# Patient Record
Sex: Male | Born: 1975 | Race: White | Hispanic: No | Marital: Married | Smoking: Current every day smoker
Health system: Southern US, Community
[De-identification: ages and names within clinical notes are randomized; demographics above are authoritative.]

## PROBLEM LIST (undated history)

## (undated) DIAGNOSIS — K5792 Diverticulitis of intestine, part unspecified, without perforation or abscess without bleeding: Secondary | ICD-10-CM

## (undated) DIAGNOSIS — N189 Chronic kidney disease, unspecified: Secondary | ICD-10-CM

## (undated) HISTORY — PX: NO PAST SURGERIES: SHX2092

---

## 2002-11-19 ENCOUNTER — Encounter: Payer: Self-pay | Admitting: Emergency Medicine

## 2002-11-19 ENCOUNTER — Emergency Department (HOSPITAL_COMMUNITY): Admission: EM | Admit: 2002-11-19 | Discharge: 2002-11-19 | Payer: Self-pay | Admitting: Emergency Medicine

## 2005-10-14 ENCOUNTER — Emergency Department: Payer: Self-pay | Admitting: Emergency Medicine

## 2009-03-09 ENCOUNTER — Emergency Department: Payer: Self-pay | Admitting: Emergency Medicine

## 2013-05-29 DIAGNOSIS — K5792 Diverticulitis of intestine, part unspecified, without perforation or abscess without bleeding: Secondary | ICD-10-CM

## 2013-05-29 HISTORY — DX: Diverticulitis of intestine, part unspecified, without perforation or abscess without bleeding: K57.92

## 2013-06-03 ENCOUNTER — Encounter (HOSPITAL_COMMUNITY): Payer: Self-pay | Admitting: Emergency Medicine

## 2013-06-03 ENCOUNTER — Emergency Department (HOSPITAL_COMMUNITY): Payer: Self-pay

## 2013-06-03 ENCOUNTER — Inpatient Hospital Stay (HOSPITAL_COMMUNITY)
Admission: EM | Admit: 2013-06-03 | Discharge: 2013-06-07 | DRG: 392 | Disposition: A | Discharge status: Home / Self Care | Payer: MEDICAID | Attending: Internal Medicine | Admitting: Internal Medicine

## 2013-06-03 DIAGNOSIS — R110 Nausea: Secondary | ICD-10-CM

## 2013-06-03 DIAGNOSIS — R112 Nausea with vomiting, unspecified: Secondary | ICD-10-CM | POA: Diagnosis present

## 2013-06-03 DIAGNOSIS — D72829 Elevated white blood cell count, unspecified: Secondary | ICD-10-CM

## 2013-06-03 DIAGNOSIS — R109 Unspecified abdominal pain: Secondary | ICD-10-CM

## 2013-06-03 DIAGNOSIS — K5792 Diverticulitis of intestine, part unspecified, without perforation or abscess without bleeding: Secondary | ICD-10-CM

## 2013-06-03 DIAGNOSIS — K5289 Other specified noninfective gastroenteritis and colitis: Secondary | ICD-10-CM | POA: Diagnosis present

## 2013-06-03 DIAGNOSIS — F17200 Nicotine dependence, unspecified, uncomplicated: Secondary | ICD-10-CM

## 2013-06-03 DIAGNOSIS — K5732 Diverticulitis of large intestine without perforation or abscess without bleeding: Principal | ICD-10-CM | POA: Diagnosis present

## 2013-06-03 DIAGNOSIS — R197 Diarrhea, unspecified: Secondary | ICD-10-CM

## 2013-06-03 DIAGNOSIS — E871 Hypo-osmolality and hyponatremia: Secondary | ICD-10-CM

## 2013-06-03 LAB — COMPREHENSIVE METABOLIC PANEL
ALT: 24 U/L (ref 0–53)
AST: 17 U/L (ref 0–37)
Albumin: 3.9 g/dL (ref 3.5–5.2)
Alkaline Phosphatase: 77 U/L (ref 39–117)
BUN: 16 mg/dL (ref 6–23)
CO2: 30 mEq/L (ref 19–32)
Calcium: 9.6 mg/dL (ref 8.4–10.5)
Chloride: 98 mEq/L (ref 96–112)
Creatinine, Ser: 1.27 mg/dL (ref 0.50–1.35)
GFR calc Af Amer: 83 mL/min — ABNORMAL LOW (ref >90)
GFR calc non Af Amer: 72 mL/min — ABNORMAL LOW (ref >90)
Glucose, Bld: 140 mg/dL — ABNORMAL HIGH (ref 70–99)
Potassium: 3.8 mEq/L (ref 3.5–5.1)
Sodium: 135 mEq/L (ref 135–145)
Total Bilirubin: 1 mg/dL (ref 0.3–1.2)
Total Protein: 7.5 g/dL (ref 6.0–8.3)

## 2013-06-03 LAB — CBC WITH DIFFERENTIAL/PLATELET
Basophils Absolute: 0 10*3/uL (ref 0.0–0.1)
Basophils Relative: 0 % (ref 0–1)
Eosinophils Absolute: 0.1 10*3/uL (ref 0.0–0.7)
Eosinophils Relative: 0 % (ref 0–5)
HCT: 49.6 % (ref 39.0–52.0)
Hemoglobin: 17.4 g/dL — ABNORMAL HIGH (ref 13.0–17.0)
Lymphocytes Relative: 15 % (ref 12–46)
Lymphs Abs: 2.5 10*3/uL (ref 0.7–4.0)
MCH: 33.9 pg (ref 26.0–34.0)
MCHC: 35.1 g/dL (ref 30.0–36.0)
MCV: 96.5 fL (ref 78.0–100.0)
Monocytes Absolute: 1.3 10*3/uL — ABNORMAL HIGH (ref 0.1–1.0)
Monocytes Relative: 8 % (ref 3–12)
Neutro Abs: 12.9 10*3/uL — ABNORMAL HIGH (ref 1.7–7.7)
Neutrophils Relative %: 77 % (ref 43–77)
Platelets: 272 10*3/uL (ref 150–400)
RBC: 5.14 MIL/uL (ref 4.22–5.81)
RDW: 12.7 % (ref 11.5–15.5)
WBC: 16.8 10*3/uL — ABNORMAL HIGH (ref 4.0–10.5)

## 2013-06-03 LAB — LIPASE, BLOOD: Lipase: 21 U/L (ref 11–59)

## 2013-06-03 MED ORDER — HYDROMORPHONE HCL PF 1 MG/ML IJ SOLN
1.0000 mg | INTRAMUSCULAR | Status: DC | PRN
  Administered 2013-06-03: 1 mg via INTRAVENOUS
  Filled 2013-06-03: qty 1

## 2013-06-03 MED ORDER — PROMETHAZINE HCL 25 MG/ML IJ SOLN
12.5000 mg | Freq: Four times a day (QID) | INTRAMUSCULAR | Status: DC | PRN
Start: 2013-06-03 — End: 2013-06-07
  Administered 2013-06-04: 12.5 mg via INTRAVENOUS
  Filled 2013-06-03: qty 1

## 2013-06-03 MED ORDER — ONDANSETRON HCL 4 MG/2ML IJ SOLN: 4.0000 mg | Freq: Three times a day (TID) | INTRAMUSCULAR | Status: AC | PRN

## 2013-06-03 MED ORDER — SODIUM CHLORIDE 0.9 % IV SOLN: INTRAVENOUS | Status: AC

## 2013-06-03 MED ORDER — IOHEXOL 300 MG/ML  SOLN
100.0000 mL | Freq: Once | INTRAMUSCULAR | Status: AC | PRN
  Administered 2013-06-03: 100 mL via INTRAVENOUS

## 2013-06-03 MED ORDER — CIPROFLOXACIN IN D5W 400 MG/200ML IV SOLN
INTRAVENOUS | Status: AC
  Filled 2013-06-03: qty 200

## 2013-06-03 MED ORDER — CIPROFLOXACIN IN D5W 400 MG/200ML IV SOLN
400.0000 mg | Freq: Two times a day (BID) | INTRAVENOUS | Status: DC
  Administered 2013-06-04 – 2013-06-07 (×7): 400 mg via INTRAVENOUS
  Filled 2013-06-03 (×7): qty 200

## 2013-06-03 MED ORDER — IOHEXOL 300 MG/ML  SOLN
50.0000 mL | Freq: Once | INTRAMUSCULAR | Status: AC | PRN
  Administered 2013-06-03: 50 mL via ORAL

## 2013-06-03 MED ORDER — ONDANSETRON HCL 4 MG PO TABS: 4.0000 mg | ORAL_TABLET | Freq: Four times a day (QID) | ORAL | Status: DC | PRN

## 2013-06-03 MED ORDER — PANTOPRAZOLE SODIUM 40 MG IV SOLR
40.0000 mg | Freq: Once | INTRAVENOUS | Status: AC
  Administered 2013-06-03: 40 mg via INTRAVENOUS
  Filled 2013-06-03: qty 40

## 2013-06-03 MED ORDER — SODIUM CHLORIDE 0.9 % IV SOLN
INTRAVENOUS | Status: DC
  Administered 2013-06-04 – 2013-06-07 (×4): via INTRAVENOUS

## 2013-06-03 MED ORDER — ACETAMINOPHEN 325 MG PO TABS: 650.0000 mg | ORAL_TABLET | Freq: Four times a day (QID) | ORAL | Status: DC | PRN

## 2013-06-03 MED ORDER — NICOTINE 21 MG/24HR TD PT24
21.0000 mg | MEDICATED_PATCH | Freq: Once | TRANSDERMAL | Status: AC
  Administered 2013-06-03 – 2013-06-04 (×2): 21 mg via TRANSDERMAL
  Filled 2013-06-03 (×2): qty 1

## 2013-06-03 MED ORDER — METRONIDAZOLE IN NACL 5-0.79 MG/ML-% IV SOLN
500.0000 mg | Freq: Once | INTRAVENOUS | Status: DC
  Filled 2013-06-03: qty 100

## 2013-06-03 MED ORDER — ONDANSETRON HCL 4 MG/2ML IJ SOLN
4.0000 mg | Freq: Once | INTRAMUSCULAR | Status: AC
  Administered 2013-06-03: 4 mg via INTRAVENOUS
  Filled 2013-06-03: qty 2

## 2013-06-03 MED ORDER — METRONIDAZOLE IN NACL 5-0.79 MG/ML-% IV SOLN
500.0000 mg | Freq: Three times a day (TID) | INTRAVENOUS | Status: DC
  Administered 2013-06-03 – 2013-06-07 (×11): 500 mg via INTRAVENOUS
  Filled 2013-06-03 (×12): qty 100

## 2013-06-03 MED ORDER — ACETAMINOPHEN 650 MG RE SUPP: 650.0000 mg | Freq: Four times a day (QID) | RECTAL | Status: DC | PRN

## 2013-06-03 MED ORDER — METRONIDAZOLE IN NACL 5-0.79 MG/ML-% IV SOLN
INTRAVENOUS | Status: AC
  Filled 2013-06-03: qty 100

## 2013-06-03 MED ORDER — HYDROMORPHONE HCL PF 1 MG/ML IJ SOLN
1.0000 mg | Freq: Once | INTRAMUSCULAR | Status: AC
  Administered 2013-06-03: 1 mg via INTRAVENOUS
  Filled 2013-06-03: qty 1

## 2013-06-03 MED ORDER — MORPHINE SULFATE 2 MG/ML IJ SOLN
2.0000 mg | INTRAMUSCULAR | Status: DC | PRN
  Administered 2013-06-04 – 2013-06-05 (×8): 2 mg via INTRAVENOUS
  Filled 2013-06-03 (×8): qty 1

## 2013-06-03 MED ORDER — ENOXAPARIN SODIUM 40 MG/0.4ML ~~LOC~~ SOLN
40.0000 mg | SUBCUTANEOUS | Status: DC
Start: 2013-06-03 — End: 2013-06-07
  Administered 2013-06-03 – 2013-06-06 (×4): 40 mg via SUBCUTANEOUS
  Filled 2013-06-03 (×3): qty 0.4

## 2013-06-03 MED ORDER — ONDANSETRON HCL 4 MG/2ML IJ SOLN
4.0000 mg | Freq: Four times a day (QID) | INTRAMUSCULAR | Status: DC | PRN
  Administered 2013-06-03 – 2013-06-06 (×3): 4 mg via INTRAVENOUS
  Filled 2013-06-03: qty 2

## 2013-06-03 MED ORDER — CIPROFLOXACIN IN D5W 400 MG/200ML IV SOLN
400.0000 mg | Freq: Once | INTRAVENOUS | Status: AC
  Administered 2013-06-03: 400 mg via INTRAVENOUS
  Filled 2013-06-03: qty 200

## 2013-06-03 NOTE — ED Provider Notes (Signed)
History  This chart was scribed for Benny Lennert, MD by Bennett Scrape, ED Scribe. This patient was seen in room APA04/APA04 and the patient's care was started at 3:32 PM.  CSN: [id number]  Arrival date & time 06/03/13  1515   First MD Initiated Contact with Patient 06/03/13 1532     Chief Complaint  Patient presents with  . Abdominal Pain    Patient is a 37 y.o. male presenting with abdominal pain. The history is provided by the patient. No language interpreter was used.  Abdominal Pain This is a new problem. The current episode started 2 days ago. The problem occurs constantly. The problem has been gradually worsening. Associated symptoms include abdominal pain. Pertinent negatives include no chest pain and no headaches. The symptoms are aggravated by walking. Nothing relieves the symptoms. Treatments tried: OTC meds. The treatment provided mild relief.    HPI Comments: Caleb Clements is a 37 y.o. male who presents to the Emergency Department complaining of constant diffuse abdominal pain described as a cramping and shooting pain with associated nausea and one episode of hematemesis described as coffee grounds that has been gradually worsening over the past 2 days. Pt reports that the pain started gradually after eating late night left-over hot dogs and lists sweats and chills with the original onset which have since resolved. He reports that the pain is aggravated by movement and admits that he has tried Pepto bismol and ex-lax with mild improvement. He reports that the pain was similar to constipation originally, but he denies having any prior episodes since the pain has worsened. He denies any prior abdominal surgeries. He denies fevers, CP and SOB as associated symptoms. Pt does not have a h/o chronic medical conditions and is a current everyday smoker but denies alcohol use.   History reviewed. No pertinent past medical history.  History reviewed. No pertinent past surgical  history.  History reviewed. No pertinent family history.  History  Substance Use Topics  . Smoking status: Current Every Day Smoker  . Smokeless tobacco: Not on file  . Alcohol Use: No    Review of Systems  Constitutional: Negative for appetite change and fatigue.  HENT: Negative for congestion, sinus pressure and ear discharge.   Eyes: Negative for discharge.  Respiratory: Negative for cough.   Cardiovascular: Negative for chest pain.  Gastrointestinal: Positive for vomiting and abdominal pain. Negative for diarrhea.  Genitourinary: Negative for frequency and hematuria.  Musculoskeletal: Negative for back pain.  Skin: Negative for rash.  Neurological: Negative for seizures and headaches.  Psychiatric/Behavioral: Negative for hallucinations.    Allergies  Review of patient's allergies indicates no known allergies.  Home Medications  No current outpatient prescriptions on file.  Triage Vitals: BP 137/81  Pulse 95  Temp(Src) 98.5 F (36.9 C) (Oral)  Resp 20  Ht 5\' 8"  (1.727 m)  Wt 180 lb (81.647 kg)  BMI 27.38 kg/m2  SpO2 98%  Physical Exam  Nursing note and vitals reviewed. Constitutional: He is oriented to person, place, and time. He appears well-developed and well-nourished.  HENT:  Head: Normocephalic and atraumatic.  Eyes: Conjunctivae and EOM are normal. No scleral icterus.  Neck: Neck supple. No thyromegaly present.  Cardiovascular: Normal rate and regular rhythm.  Exam reveals no gallop and no friction rub.   No murmur heard. Pulmonary/Chest: Effort normal and breath sounds normal. No stridor. He has no wheezes. He has no rales. He exhibits no tenderness.  Abdominal: Soft. He exhibits distension (mild). There is  tenderness. There is no rebound.  Moderate tenderness throughout the abdomen  Musculoskeletal: Normal range of motion. He exhibits no edema.  Lymphadenopathy:    He has no cervical adenopathy.  Neurological: He is alert and oriented to person,  place, and time. Coordination normal.  Skin: Skin is warm and dry. No rash noted. No erythema.  Psychiatric: He has a normal mood and affect. His behavior is normal.    ED Course  Procedures (including critical care time)  Medications  HYDROmorphone (DILAUDID) injection 1 mg (not administered)  ondansetron (ZOFRAN) injection 4 mg (not administered)  pantoprazole (PROTONIX) injection 40 mg (not administered)    DIAGNOSTIC STUDIES: Oxygen Saturation is 98% on room air, normal by my interpretation.    COORDINATION OF CARE: 3:38 PM-Discussed treatment plan which includes medications, CT of abdomen, CBC panel and CMP with pt at bedside and pt agreed to plan.   Labs Reviewed  CBC WITH DIFFERENTIAL  COMPREHENSIVE METABOLIC PANEL   No results found. No diagnosis found.  MDM  diverticulitis The chart was scribed for me under my direct supervision.  I personally performed the history, physical, and medical decision making and all procedures in the evaluation of this patient.Benny Lennert, MD 06/03/13 [phone]

## 2013-06-03 NOTE — ED Notes (Signed)
Mid abd pain x 2 days. N/v once last night, none since and states emesis was coffee ground black. deneis diarrhea. Pain worse with movement. lnbm this am and was black. Mm dry. Aching pain.

## 2013-06-03 NOTE — H&P (Signed)
PCP:   No primary provider on file.   Chief Complaint:  Abdominal pain  HPI: -year-old male with no significant past medical history came to the ED after patient started abdominal pain for 2 days. Patient says that on July 4 toes of [employer] out and he ate sausage, and after that patient started having abdominal pain which was in the whole abdomen, he also admits to having some nausea but no vomiting. This morning patient started shivering and had worsening of the pain. Yesterday patient took medications for diarrhea over-the-counter along with Pepto-Bismol. This morning patient had black colored runny diarrhea. He denies chest pain or shortness of breath. No dysuria urgency frequency of urination.  Allergies:  No Known Allergies   History reviewed. No pertinent past medical history.  History reviewed. No pertinent past surgical history.  Prior to Admission medications   Not on File    Social History:  reports that he has been smoking.  He does not have any smokeless tobacco history on file. He reports that he does not drink alcohol or use illicit drugs.  History reviewed. No pertinent family history.   All the positives are listed in BOLD  Review of Systems:  HEENT: Denies headache, blurred vision, runny nose, sore throat,  Neck: Denies thyroid problems,lymphadenopathy Chest : Denies shortness of breath, no history of COPD Heart : Denies Chest pain,  coronary arterey disease GI: Denies  nausea, vomiting, diarrhea, constipation GU: Denies dysuria, urgency, frequency of urination, hematuria Neuro: Denies stroke, seizures, syncope Psych: Denies depression, anxiety, hallucinations   Physical Exam: Blood pressure 113/68, pulse 69, temperature 98.5 F (36.9 C), temperature source Oral, resp. rate 16, height 5\' 8"  (1.727 m), weight 81.647 kg (180 lb), SpO2 97.00%. Constitutional:   Patient is a well-developed and well-nourished male* in no acute distress and cooperative with  exam. Head: Normocephalic and atraumatic Mouth: Mucus membranes moist Eyes: PERRL, EOMI, conjunctivae normal Neck: Supple, No Thyromegaly Cardiovascular: RRR, S1 normal, S2 normal Pulmonary/Chest: CTAB, no wheezes, rales, or rhonchi Abdominal: Soft. Positive tenderness in the right and left lower quadrants and right upper quadrant, no guarding no rigidity positive rebound tenderness   Neurological: A&O x3, Strenght is normal and symmetric bilaterally, cranial nerve II-XII are grossly intact, no focal motor deficit, sensory intact to light touch bilaterally.  Extremities : No Cyanosis, Clubbing or Edema   Labs on Admission:  Results for orders placed during the hospital encounter of 06/03/13 (from the past 48 hour(s))  CBC WITH DIFFERENTIAL     Status: Abnormal   Collection Time    06/03/13  3:45 PM      Result Value Range   WBC 16.8 (*) 4.0 - 10.5 K/uL   RBC 5.14  4.22 - 5.81 MIL/uL   Hemoglobin 17.4 (*) 13.0 - 17.0 g/dL   HCT 16.1  09.6 - 04.5 %   MCV 96.5  78.0 - 100.0 fL   MCH 33.9  26.0 - 34.0 pg   MCHC 35.1  30.0 - 36.0 g/dL   RDW 40.9  81.1 - 91.4 %   Platelets 272  150 - 400 K/uL   Neutrophils Relative % 77  43 - 77 %   Neutro Abs 12.9 (*) 1.7 - 7.7 K/uL   Lymphocytes Relative 15  12 - 46 %   Lymphs Abs 2.5  0.7 - 4.0 K/uL   Monocytes Relative 8  3 - 12 %   Monocytes Absolute 1.3 (*) 0.1 - 1.0 K/uL   Eosinophils Relative 0  0 - 5 %   Eosinophils Absolute 0.1  0.0 - 0.7 K/uL   Basophils Relative 0  0 - 1 %   Basophils Absolute 0.0  0.0 - 0.1 K/uL  COMPREHENSIVE METABOLIC PANEL     Status: Abnormal   Collection Time    06/03/13  3:45 PM      Result Value Range   Sodium 135  135 - 145 mEq/L   Potassium 3.8  3.5 - 5.1 mEq/L   Chloride 98  96 - 112 mEq/L   CO2 30  19 - 32 mEq/L   Glucose, Bld 140 (*) 70 - 99 mg/dL   BUN 16  6 - 23 mg/dL   Creatinine, Ser 8.29  0.50 - 1.35 mg/dL   Calcium 9.6  8.4 - 56.2 mg/dL   Total Protein 7.5  6.0 - 8.3 g/dL   Albumin 3.9  3.5 -  5.2 g/dL   AST 17  0 - 37 U/L   ALT 24  0 - 53 U/L   Alkaline Phosphatase 77  39 - 117 U/L   Total Bilirubin 1.0  0.3 - 1.2 mg/dL   GFR calc non Af Amer 72 (*) >90 mL/min   GFR calc Af Amer 83 (*) >90 mL/min   Comment:            The eGFR has been calculated     using the CKD EPI equation.     This calculation has not been     validated in all clinical     situations.     eGFR's persistently     <90 mL/min signify     possible Chronic Kidney Disease.  LIPASE, BLOOD     Status: None   Collection Time    06/03/13  3:45 PM      Result Value Range   Lipase 21  11 - 59 U/L    Radiological Exams on Admission: Ct Abdomen Pelvis W Contrast  06/03/2013   *RADIOLOGY REPORT*  Clinical Data: Abdominal pain, nausea, hematemesis  CT ABDOMEN AND PELVIS WITH CONTRAST  Technique:  Multidetector CT imaging of the abdomen and pelvis was performed following the standard protocol during bolus administration of intravenous contrast.  Contrast: Omnipaque-300 IV  Comparison: None.  Findings: Mild dependent atelectasis lung bases.  Liver, spleen, pancreas, and adrenal glands are within normal limits.  Gallbladder is unremarkable.  No intrahepatic or extrahepatic ductal dilatation.  9 mm right upper pole renal cyst (series 2/image 3).  Left kidney is within normal limits.  No hydronephrosis.  No evidence of bowel obstruction.  Normal appendix.  Wall thickening/inflammatory changes along the proximal sigmoid colon (series 2/image 77), reflecting sigmoid diverticulitis.  A few scattered tiny foci of extraluminal gas anteriorly (series 2/images 76-77), compatible with microperforation. No drainable fluid collection/abscess.  No free air.  No evidence of abdominal aortic aneurysm.  No abdominopelvic ascites.  No suspicious abdominopelvic lymphadenopathy.  Prostate is unremarkable.  Bladder is within normal limits.  Visualized osseous structures are within normal limits.  IMPRESSION: Sigmoid diverticulitis.  Tiny foci of  localized extraluminal gas, suggesting microperforation.  No drainable fluid collection/abscess.  No free air.   Original Report Authenticated By: Charline Bills, M.D.    Assessment/Plan Active Problems:   Diverticulitis  Diverticulitis  Patient has sigmoid due to colitis with tiny foci of localized extraluminal gas suggesting microperforation. I called and discussed with general surgeon Dr. Lovell Sheehan who will see the patient in the morning In the meantime we'll keep him  n.p.o. IV normal saline at 125 mL per hour Him IV Cipro and Flagyl  Leukocytosis Secondary to above Follow the CBC the morning  Code status: Full code, presumed  Family discussion: Discussed with patient in detail   Time Spent on Admission: 65 min  Quron Ruddy S [facility] Pager: [phone] 06/03/2013, 8:08 PM  If 7PM-7AM, please contact night-coverage  [email or web address]  Password [email or web address]

## 2013-06-04 DIAGNOSIS — D72829 Elevated white blood cell count, unspecified: Secondary | ICD-10-CM | POA: Diagnosis present

## 2013-06-04 DIAGNOSIS — R197 Diarrhea, unspecified: Secondary | ICD-10-CM | POA: Diagnosis present

## 2013-06-04 DIAGNOSIS — R110 Nausea: Secondary | ICD-10-CM | POA: Diagnosis present

## 2013-06-04 DIAGNOSIS — R109 Unspecified abdominal pain: Secondary | ICD-10-CM | POA: Diagnosis present

## 2013-06-04 DIAGNOSIS — R112 Nausea with vomiting, unspecified: Secondary | ICD-10-CM | POA: Diagnosis present

## 2013-06-04 LAB — CBC
MCH: 33.4 pg (ref 26.0–34.0)
MCHC: 34.8 g/dL (ref 30.0–36.0)
Platelets: 289 10*3/uL (ref 150–400)
RDW: 12.7 % (ref 11.5–15.5)

## 2013-06-04 LAB — COMPREHENSIVE METABOLIC PANEL
ALT: 21 U/L (ref 0–53)
AST: 14 U/L (ref 0–37)
Calcium: 8.8 mg/dL (ref 8.4–10.5)
GFR calc Af Amer: >90 mL/min (ref >90)
Glucose, Bld: 96 mg/dL (ref 70–99)
Sodium: 137 mEq/L (ref 135–145)
Total Protein: 6.6 g/dL (ref 6.0–8.3)

## 2013-06-04 NOTE — Consult Note (Signed)
Reason for Consult: Sigmoid diverticulitis Referring Physician: [facility]  Jerrian Mells is an 37 y.o. male.  HPI: Patient is a 37 year old white male who was admitted by the hospitalist for treatment of sigmoid diverticulitis. This is his first episode. CT scan the abdomen reveals sigmoid diverticulitis with microperforation but no extensive pneumoperitoneum or abscess formation.  History reviewed. No pertinent past medical history.  History reviewed. No pertinent past surgical history.  History reviewed. No pertinent family history.  Social History:  reports that he has been smoking.  He uses smokeless tobacco. He reports that he does not drink alcohol or use illicit drugs.  Allergies: No Known Allergies  Medications: I have reviewed the patient's current medications.  Results for orders placed during the hospital encounter of 06/03/13 (from the past 48 hour(s))  CBC WITH DIFFERENTIAL     Status: Abnormal   Collection Time    06/03/13  3:45 PM      Result Value Range   WBC 16.8 (*) 4.0 - 10.5 K/uL   RBC 5.14  4.22 - 5.81 MIL/uL   Hemoglobin 17.4 (*) 13.0 - 17.0 g/dL   HCT 54.0  98.1 - 19.1 %   MCV 96.5  78.0 - 100.0 fL   MCH 33.9  26.0 - 34.0 pg   MCHC 35.1  30.0 - 36.0 g/dL   RDW 47.8  29.5 - 62.1 %   Platelets 272  150 - 400 K/uL   Neutrophils Relative % 77  43 - 77 %   Neutro Abs 12.9 (*) 1.7 - 7.7 K/uL   Lymphocytes Relative 15  12 - 46 %   Lymphs Abs 2.5  0.7 - 4.0 K/uL   Monocytes Relative 8  3 - 12 %   Monocytes Absolute 1.3 (*) 0.1 - 1.0 K/uL   Eosinophils Relative 0  0 - 5 %   Eosinophils Absolute 0.1  0.0 - 0.7 K/uL   Basophils Relative 0  0 - 1 %   Basophils Absolute 0.0  0.0 - 0.1 K/uL  COMPREHENSIVE METABOLIC PANEL     Status: Abnormal   Collection Time    06/03/13  3:45 PM      Result Value Range   Sodium 135  135 - 145 mEq/L   Potassium 3.8  3.5 - 5.1 mEq/L   Chloride 98  96 - 112 mEq/L   CO2 30  19 - 32 mEq/L   Glucose, Bld 140 (*) 70 - 99  mg/dL   BUN 16  6 - 23 mg/dL   Creatinine, Ser 3.08  0.50 - 1.35 mg/dL   Calcium 9.6  8.4 - 65.7 mg/dL   Total Protein 7.5  6.0 - 8.3 g/dL   Albumin 3.9  3.5 - 5.2 g/dL   AST 17  0 - 37 U/L   ALT 24  0 - 53 U/L   Alkaline Phosphatase 77  39 - 117 U/L   Total Bilirubin 1.0  0.3 - 1.2 mg/dL   GFR calc non Af Amer 72 (*) >90 mL/min   GFR calc Af Amer 83 (*) >90 mL/min   Comment:            The eGFR has been calculated     using the CKD EPI equation.     This calculation has not been     validated in all clinical     situations.     eGFR's persistently     <90 mL/min signify     possible Chronic Kidney  Disease.  LIPASE, BLOOD     Status: None   Collection Time    06/03/13  3:45 PM      Result Value Range   Lipase 21  11 - 59 U/L  CBC     Status: Abnormal   Collection Time    06/04/13  5:22 AM      Result Value Range   WBC 16.8 (*) 4.0 - 10.5 K/uL   RBC 4.67  4.22 - 5.81 MIL/uL   Hemoglobin 15.6  13.0 - 17.0 g/dL   HCT 40.9  81.1 - 91.4 %   MCV 95.9  78.0 - 100.0 fL   MCH 33.4  26.0 - 34.0 pg   MCHC 34.8  30.0 - 36.0 g/dL   RDW 78.2  95.6 - 21.3 %   Platelets 289  150 - 400 K/uL  COMPREHENSIVE METABOLIC PANEL     Status: Abnormal   Collection Time    06/04/13  5:22 AM      Result Value Range   Sodium 137  135 - 145 mEq/L   Potassium 3.6  3.5 - 5.1 mEq/L   Chloride 101  96 - 112 mEq/L   CO2 26  19 - 32 mEq/L   Glucose, Bld 96  70 - 99 mg/dL   BUN 15  6 - 23 mg/dL   Creatinine, Ser 0.86  0.50 - 1.35 mg/dL   Calcium 8.8  8.4 - 57.8 mg/dL   Total Protein 6.6  6.0 - 8.3 g/dL   Albumin 3.4 (*) 3.5 - 5.2 g/dL   AST 14  0 - 37 U/L   ALT 21  0 - 53 U/L   Alkaline Phosphatase 65  39 - 117 U/L   Total Bilirubin 0.9  0.3 - 1.2 mg/dL   GFR calc non Af Amer 86 (*) >90 mL/min   GFR calc Af Amer >90  >90 mL/min   Comment:            The eGFR has been calculated     using the CKD EPI equation.     This calculation has not been     validated in all clinical     situations.      eGFR's persistently     <90 mL/min signify     possible Chronic Kidney Disease.    Ct Abdomen Pelvis W Contrast  06/03/2013   *RADIOLOGY REPORT*  Clinical Data: Abdominal pain, nausea, hematemesis  CT ABDOMEN AND PELVIS WITH CONTRAST  Technique:  Multidetector CT imaging of the abdomen and pelvis was performed following the standard protocol during bolus administration of intravenous contrast.  Contrast: Omnipaque-300 IV  Comparison: None.  Findings: Mild dependent atelectasis lung bases.  Liver, spleen, pancreas, and adrenal glands are within normal limits.  Gallbladder is unremarkable.  No intrahepatic or extrahepatic ductal dilatation.  9 mm right upper pole renal cyst (series 2/image 3).  Left kidney is within normal limits.  No hydronephrosis.  No evidence of bowel obstruction.  Normal appendix.  Wall thickening/inflammatory changes along the proximal sigmoid colon (series 2/image 77), reflecting sigmoid diverticulitis.  A few scattered tiny foci of extraluminal gas anteriorly (series 2/images 76-77), compatible with microperforation. No drainable fluid collection/abscess.  No free air.  No evidence of abdominal aortic aneurysm.  No abdominopelvic ascites.  No suspicious abdominopelvic lymphadenopathy.  Prostate is unremarkable.  Bladder is within normal limits.  Visualized osseous structures are within normal limits.  IMPRESSION: Sigmoid diverticulitis.  Tiny foci of localized extraluminal gas,  suggesting microperforation.  No drainable fluid collection/abscess.  No free air.   Original Report Authenticated By: Charline Bills, M.D.    ROS: See chart Blood pressure 122/68, pulse 52, temperature 98.2 F (36.8 C), temperature source Oral, resp. rate 20, height 5\' 8"  (1.727 m), weight 89.268 kg (196 lb 12.8 oz), SpO2 99.00%. Physical Exam: Pleasant white male no acute distress. Abdomen is soft but diffusely tender. No rigidity is noted. No pedal splenomegaly or masses are  noted.  Assessment/Plan: Impression: Sigmoid diverticulitis with microperforation. No significant peritonitis at this time. No need for acute surgical intervention. Plan: Continue current treatment plan. We'll follow with you.  Hayato Guaman A 06/04/2013, 8:54 AM

## 2013-06-04 NOTE — Progress Notes (Signed)
Utilization Review Complete  

## 2013-06-04 NOTE — Progress Notes (Signed)
[facility] PROGRESS NOTE  Caleb Clements TFT:[id number] DOB: 03-Nov-1976 DOA: 06/03/2013 PCP: No primary provider on file.  Assessment/Plan:  Diverticulitis: CT abdomen notes sigmoid diverticulitis with concerns for microperforation. Continues with diffuse abdominal pain. Await surgical consult. Continue cipro and flagy day #2. Provide pain medication. Keep NPO for now. Monitor.  Active Problems:  Leukocytosis, unspecified: likely related to above. Pt is afebrile and non-toxic appearing. Continue cipro/flagyl day #2. Monitor.   Abdominal pain, unspecified site: diffuse with mild distention. Continue with pain medication as getting adequate relief.   Nausea: improved. Continue anti-emetic  Diarrhea: one episode since admission.   Code Status: full Family Communication: wife at bedside Disposition Plan: home when ready.  Consultants:  Dr. Lovell Sheehan general surgery  Procedures:  none  Antibiotics:  cipro 06/03/13>>  Flagyl 06/03/13>>  HPI/Subjective: Awake, reports feeling "slightly better i reckon" reports continued abdominal pain at rest worse with movement.   Objective: Filed Vitals:   06/03/13 1859 06/03/13 1943 06/03/13 2342 06/04/13 0455  BP: 113/68 126/78 110/70 122/68  Pulse: 69 66 62 52  Temp:  98.5 F (36.9 C) 98.3 F (36.8 C) 98.2 F (36.8 C)  TempSrc:  Oral Oral Oral  Resp:  22 20 20   Height:  5\' 8"  (1.727 m)    Weight:  89.268 kg (196 lb 12.8 oz)    SpO2: 97% 98% 96% 99%    Intake/Output Summary (Last 24 hours) at 06/04/13 0843 Last data filed at 06/04/13 0700  Gross per 24 hour  Intake 1756.25 ml  Output    400 ml  Net 1356.25 ml   Filed Weights   06/03/13 1528 06/03/13 1943  Weight: 81.647 kg (180 lb) 89.268 kg (196 lb 12.8 oz)    Exam:   General:  Well nourished NAD  Cardiovascular: RRR No MGR No LE edema  Respiratory: normal effort BS clear bilaterally no wheeze no rhonchi  Abdomen: mildly distended. Soft +BS diffuse tenderness  to very soft palpation. No guarding no rigidity  Musculoskeletal: no clubbing no cyanosis   Data Reviewed: Basic Metabolic Panel:  Recent Labs Lab 06/03/13 1545 06/04/13 0522  NA 135 137  K 3.8 3.6  CL 98 101  CO2 30 26  GLUCOSE 140* 96  BUN 16 15  CREATININE 1.27 1.10  CALCIUM 9.6 8.8   Liver Function Tests:  Recent Labs Lab 06/03/13 1545 06/04/13 0522  AST 17 14  ALT 24 21  ALKPHOS 77 65  BILITOT 1.0 0.9  PROT 7.5 6.6  ALBUMIN 3.9 3.4*    Recent Labs Lab 06/03/13 1545  LIPASE 21   CBC:  Recent Labs Lab 06/03/13 1545 06/04/13 0522  WBC 16.8* 16.8*  NEUTROABS 12.9*  --   HGB 17.4* 15.6  HCT 49.6 44.8  MCV 96.5 95.9  PLT 272 289   Studies: Ct Abdomen Pelvis W Contrast  06/03/2013   *RADIOLOGY REPORT*  Clinical Data: Abdominal pain, nausea, hematemesis  CT ABDOMEN AND PELVIS WITH CONTRAST  Technique:  Multidetector CT imaging of the abdomen and pelvis was performed following the standard protocol during bolus administration of intravenous contrast.  Contrast: Omnipaque-300 IV  Comparison: None.  Findings: Mild dependent atelectasis lung bases.  Liver, spleen, pancreas, and adrenal glands are within normal limits.  Gallbladder is unremarkable.  No intrahepatic or extrahepatic ductal dilatation.  9 mm right upper pole renal cyst (series 2/image 3).  Left kidney is within normal limits.  No hydronephrosis.  No evidence of bowel obstruction.  Normal appendix.  Wall thickening/inflammatory  changes along the proximal sigmoid colon (series 2/image 77), reflecting sigmoid diverticulitis.  A few scattered tiny foci of extraluminal gas anteriorly (series 2/images 76-77), compatible with microperforation. No drainable fluid collection/abscess.  No free air.  No evidence of abdominal aortic aneurysm.  No abdominopelvic ascites.  No suspicious abdominopelvic lymphadenopathy.  Prostate is unremarkable.  Bladder is within normal limits.  Visualized osseous structures are within  normal limits.  IMPRESSION: Sigmoid diverticulitis.  Tiny foci of localized extraluminal gas, suggesting microperforation.  No drainable fluid collection/abscess.  No free air.   Original Report Authenticated By: Charline Bills, M.D.    Scheduled Meds: . sodium chloride   Intravenous STAT  . ciprofloxacin  400 mg Intravenous Q12H  . enoxaparin (LOVENOX) injection  40 mg Subcutaneous Q24H  . metronidazole  500 mg Intravenous Q8H  . nicotine  21 mg Transdermal Once   Continuous Infusions: . sodium chloride 125 mL/hr at 06/04/13 0432   Time spent: 35 minutes  [facility] M  [facility] Pager [phone]. If 7PM-7AM, please contact night-coverage at [email or web address], password [facility] 06/04/2013, 8:43 AM  LOS: 1 day   I have seen and examined this patient and I agree with the above assessment and plan.  Sundeep Cary M. Elvera Lennox, MD [facility] [phone]

## 2013-06-05 ENCOUNTER — Inpatient Hospital Stay (HOSPITAL_COMMUNITY): Payer: MEDICAID

## 2013-06-05 DIAGNOSIS — R197 Diarrhea, unspecified: Secondary | ICD-10-CM

## 2013-06-05 DIAGNOSIS — E871 Hypo-osmolality and hyponatremia: Secondary | ICD-10-CM | POA: Diagnosis not present

## 2013-06-05 LAB — BASIC METABOLIC PANEL
BUN: 14 mg/dL (ref 6–23)
Chloride: 100 mEq/L (ref 96–112)
GFR calc non Af Amer: >90 mL/min (ref >90)
Glucose, Bld: 87 mg/dL (ref 70–99)
Potassium: 3.8 mEq/L (ref 3.5–5.1)

## 2013-06-05 LAB — CBC
HCT: 45.6 % (ref 39.0–52.0)
Hemoglobin: 15.8 g/dL (ref 13.0–17.0)
MCHC: 34.6 g/dL (ref 30.0–36.0)

## 2013-06-05 NOTE — Progress Notes (Signed)
[facility] PROGRESS NOTE  Arty Gradillas WUJ:[id number] DOB: [birth year] DOA: 06/03/2013 PCP: No primary provider on file.  Assessment/Plan: Diverticulitis: CT abdomen notes sigmoid diverticulitis with concerns for microperforation. Continues with diffuse abdominal pain. Slightly more distended today. Reports 2 loose stool.  Abdominal xray today with no acute abnormalities.  Appreciate surgical consult. Continue cipro and flagy day #3. Provide pain medication. Advance diet to clear liquid per surgery. Monitor.  Active Problems:   Leukocytosis, unspecified: trending upward today. See #1. Max temp 99.7.  Continue cipro/flagyl day #3. Hemodynamically stable. Continue IV fluids.  Monitor.   Abdominal pain, unspecified site: diffuse with slightly worsening distention. Continue with pain medication as getting adequate relief.   Nausea: No vomiting. Continue anti-emetic   Diarrhea: one episode since admission.   Hyponatremia: Mild. likely related to NPO status and #1. Will continue IV NS. Recheck in am  Code Status: full Family Communication: wife at bedside Disposition Plan: home when ready  Consultants:  Dr. Lovell Sheehan general surgery  Procedures:  none  Antibiotics: cipro 06/03/13>>  Flagyl 06/03/13>>   HPI/Subjective: Alert. Reports feeling about the same "no better no worse".   Objective: Filed Vitals:   06/04/13 0455 06/04/13 1330 06/04/13 2112 06/05/13 0537  BP: 122/68 122/67 109/62 114/79  Pulse: 52 82 77 75  Temp: 98.2 F (36.8 C) 98.9 F (37.2 C) 99.3 F (37.4 C) 98.8 F (37.1 C)  TempSrc: Oral  Oral Oral  Resp: 20 20 20 20   Height:      Weight:      SpO2: 99% 97% 94% 96%    Intake/Output Summary (Last 24 hours) at 06/05/13 1030 Last data filed at 06/04/13 2359  Gross per 24 hour  Intake 1931.25 ml  Output      0 ml  Net 1931.25 ml   Filed Weights   06/03/13 1528 06/03/13 1943  Weight: 81.647 kg (180 lb) 89.268 kg (196 lb 12.8 oz)     Exam:  General:  Somewhat ill appearing NAD  Cardiovascular: RRR No MGR No LE edema PPP  Respiratory: normal effort BS clear to ausculation bilaterally no wheeze no rhonchi  Abdomen: increased distention, soft +BS but very sluggish moderate diffuse tenderness to palpation particularly LLQ. No rigidity.   Musculoskeletal: no clubbing no cyanosis  Data Reviewed: Basic Metabolic Panel:  Recent Labs Lab 06/03/13 1545 06/04/13 0522 06/05/13 0603  NA 135 137 133*  K 3.8 3.6 3.8  CL 98 101 100  CO2 30 26 24   GLUCOSE 140* 96 87  BUN 16 15 14   CREATININE 1.27 1.10 1.05  CALCIUM 9.6 8.8 8.8   Liver Function Tests:  Recent Labs Lab 06/03/13 1545 06/04/13 0522  AST 17 14  ALT 24 21  ALKPHOS 77 65  BILITOT 1.0 0.9  PROT 7.5 6.6  ALBUMIN 3.9 3.4*    Recent Labs Lab 06/03/13 1545  LIPASE 21   CBC:  Recent Labs Lab 06/03/13 1545 06/04/13 0522 06/05/13 0603  WBC 16.8* 16.8* 19.3*  NEUTROABS 12.9*  --   --   HGB 17.4* 15.6 15.8  HCT 49.6 44.8 45.6  MCV 96.5 95.9 96.0  PLT 272 289 300   Studies: Ct Abdomen Pelvis W Contrast  06/03/2013   *RADIOLOGY REPORT*  Clinical Data: Abdominal pain, nausea, hematemesis  CT ABDOMEN AND PELVIS WITH CONTRAST  Technique:  Multidetector CT imaging of the abdomen and pelvis was performed following the standard protocol during bolus administration of intravenous contrast.  Contrast: Omnipaque-300 IV  Comparison: None.  Findings: Mild dependent atelectasis lung bases.  Liver, spleen, pancreas, and adrenal glands are within normal limits.  Gallbladder is unremarkable.  No intrahepatic or extrahepatic ductal dilatation.  9 mm right upper pole renal cyst (series 2/image 3).  Left kidney is within normal limits.  No hydronephrosis.  No evidence of bowel obstruction.  Normal appendix.  Wall thickening/inflammatory changes along the proximal sigmoid colon (series 2/image 77), reflecting sigmoid diverticulitis.  A few scattered tiny foci of  extraluminal gas anteriorly (series 2/images 76-77), compatible with microperforation. No drainable fluid collection/abscess.  No free air.  No evidence of abdominal aortic aneurysm.  No abdominopelvic ascites.  No suspicious abdominopelvic lymphadenopathy.  Prostate is unremarkable.  Bladder is within normal limits.  Visualized osseous structures are within normal limits.  IMPRESSION: Sigmoid diverticulitis.  Tiny foci of localized extraluminal gas, suggesting microperforation.  No drainable fluid collection/abscess.  No free air.   Original Report Authenticated By: Charline Bills, M.D.   Dg Abd 2 Views  06/05/2013   *RADIOLOGY REPORT*  Clinical Data: Pattern, abdominal pain, distention, diverticulitis with sigmoid microperforation  ABDOMEN - 2 VIEW  Comparison: CT abdomen and pelvis 06/03/2013  Findings: Small amount of retained contrast within a nondistended colon. Nonobstructive bowel gas pattern. No bowel dilatation bowel wall thickening or free intraperitoneal air. Bones unremarkable. No gross urinary tract calcification.  IMPRESSION: No acute abnormalities.   Original Report Authenticated By: Ulyses Southward, M.D.    Scheduled Meds: . ciprofloxacin  400 mg Intravenous Q12H  . enoxaparin (LOVENOX) injection  40 mg Subcutaneous Q24H  . metronidazole  500 mg Intravenous Q8H  . [COMPLETED] nicotine  21 mg Transdermal Once   Continuous Infusions: . sodium chloride 125 mL/hr at 06/05/13 0305    Principal Problem:   Diverticulitis Active Problems:   Leukocytosis, unspecified   Abdominal pain, unspecified site   Nausea alone   Diarrhea  Time spent: 30 minutes  [facility] M  [facility] Pager [phone]. If 7PM-7AM, please contact night-coverage at [email or web address], password [facility] 06/05/2013, 10:30 AM  LOS: 2 days    I have seen and examined this patient and I agree with the above assessment and plan. Landi Biscardi M. Elvera Lennox, MD [facility] [phone]

## 2013-06-05 NOTE — Progress Notes (Signed)
Subjective: States he feels about the same. He has had some watery bowel movements.  Objective: Vital signs in last 24 hours: Temp:  [98.8 F (37.1 C)-99.3 F (37.4 C)] 98.8 F (37.1 C) (07/08 0537) Pulse Rate:  [75-82] 75 (07/08 0537) Resp:  [20] 20 (07/08 0537) BP: (109-122)/(62-79) 114/79 mmHg (07/08 0537) SpO2:  [94 %-97 %] 96 % (07/08 0537) Last BM Date: 06/03/13  Intake/Output from previous day: 07/07 0701 - 07/08 0700 In: 1931.3 [I.V.:1531.3; IV Piggyback:400] Out: -  Intake/Output this shift:    General appearance: alert, cooperative and no distress GI: Slightly more distended than yesterday. He does have some bowel sounds. No rigidity is noted. He does have left-sided lower quadrant tenderness to deep palpation.  Lab Results:   Recent Labs  06/04/13 0522 06/05/13 0603  WBC 16.8* 19.3*  HGB 15.6 15.8  HCT 44.8 45.6  PLT 289 300   BMET  Recent Labs  06/04/13 0522 06/05/13 0603  NA 137 133*  K 3.6 3.8  CL 101 100  CO2 26 24  GLUCOSE 96 87  BUN 15 14  CREATININE 1.10 1.05  CALCIUM 8.8 8.8   PT/INR No results found for this basename: LABPROT, INR,  in the last 72 hours  Studies/Results: Ct Abdomen Pelvis W Contrast  06/03/2013   *RADIOLOGY REPORT*  Clinical Data: Abdominal pain, nausea, hematemesis  CT ABDOMEN AND PELVIS WITH CONTRAST  Technique:  Multidetector CT imaging of the abdomen and pelvis was performed following the standard protocol during bolus administration of intravenous contrast.  Contrast: Omnipaque-300 IV  Comparison: None.  Findings: Mild dependent atelectasis lung bases.  Liver, spleen, pancreas, and adrenal glands are within normal limits.  Gallbladder is unremarkable.  No intrahepatic or extrahepatic ductal dilatation.  9 mm right upper pole renal cyst (series 2/image 3).  Left kidney is within normal limits.  No hydronephrosis.  No evidence of bowel obstruction.  Normal appendix.  Wall thickening/inflammatory changes along the  proximal sigmoid colon (series 2/image 77), reflecting sigmoid diverticulitis.  A few scattered tiny foci of extraluminal gas anteriorly (series 2/images 76-77), compatible with microperforation. No drainable fluid collection/abscess.  No free air.  No evidence of abdominal aortic aneurysm.  No abdominopelvic ascites.  No suspicious abdominopelvic lymphadenopathy.  Prostate is unremarkable.  Bladder is within normal limits.  Visualized osseous structures are within normal limits.  IMPRESSION: Sigmoid diverticulitis.  Tiny foci of localized extraluminal gas, suggesting microperforation.  No drainable fluid collection/abscess.  No free air.   Original Report Authenticated By: Charline Bills, M.D.    Anti-infectives: Anti-infectives   Start     Dose/Rate Route Frequency Ordered Stop   06/04/13 0700  ciprofloxacin (CIPRO) IVPB 400 mg     400 mg 200 mL/hr over 60 Minutes Intravenous Every 12 hours 06/03/13 2037     06/03/13 2045  metroNIDAZOLE (FLAGYL) IVPB 500 mg     500 mg 100 mL/hr over 60 Minutes Intravenous Every 8 hours 06/03/13 2037     06/03/13 1915  ciprofloxacin (CIPRO) IVPB 400 mg     400 mg 200 mL/hr over 60 Minutes Intravenous  Once 06/03/13 1906 06/03/13 2013   06/03/13 1915  metroNIDAZOLE (FLAGYL) IVPB 500 mg  Status:  Discontinued     500 mg 100 mL/hr over 60 Minutes Intravenous  Once 06/03/13 1906 06/03/13 2037      Assessment/Plan: Sigmoid diverticulitis with microperforation. Leukocytosis still present. Agree with followup abdominal films. We'll continue to follow with you.  LOS: 2 days  Franky Macho A 06/05/2013

## 2013-06-06 DIAGNOSIS — F17200 Nicotine dependence, unspecified, uncomplicated: Secondary | ICD-10-CM | POA: Diagnosis present

## 2013-06-06 LAB — CBC
HCT: 43.8 % (ref 39.0–52.0)
Hemoglobin: 15.1 g/dL (ref 13.0–17.0)
MCH: 33.1 pg (ref 26.0–34.0)
MCV: 96.1 fL (ref 78.0–100.0)
RBC: 4.56 MIL/uL (ref 4.22–5.81)

## 2013-06-06 LAB — BASIC METABOLIC PANEL
BUN: 13 mg/dL (ref 6–23)
CO2: 24 mEq/L (ref 19–32)
Calcium: 8.8 mg/dL (ref 8.4–10.5)
Creatinine, Ser: 1.05 mg/dL (ref 0.50–1.35)
Glucose, Bld: 80 mg/dL (ref 70–99)

## 2013-06-06 MED ORDER — NICOTINE 21 MG/24HR TD PT24
21.0000 mg | MEDICATED_PATCH | Freq: Every day | TRANSDERMAL | Status: DC
  Administered 2013-06-06: 21 mg via TRANSDERMAL
  Filled 2013-06-06: qty 1

## 2013-06-06 NOTE — Progress Notes (Signed)
[facility] PROGRESS NOTE  Caleb Clements YQI:[id number] DOB: 1976/03/07 DOA: 06/03/2013 PCP: No primary provider on file.  Assessment/Plan: Diverticulitis: CT abdomen notes sigmoid diverticulitis with concerns for microperforation. Much improved today. Afebrile, WC trending downward. Slightly less distended.  Reports rormed stool.  Appreciate surgical consult. Continue cipro and flagy day #4.Advance diet per surgery. Continue pain medication.   Active Problems:   Leukocytosis, unspecified: trending down today. See #1. Afebrile.  Continue cipro/flagyl day #4. Hemodynamically stable. Continue IV fluids at lesser rate. Monitor.   Abdominal pain, unspecified site: Much improved today.  Continue with pain medication as getting adequate relief.   Nausea: much improved. No vomiting. Advancing diet Continue anti-emetic   Diarrhea: one episode since admission. Formed stool this am.   Hyponatremia: Mild. Resolved.  likely related to NPO status and #1.     Code Status: full Family Communication:  Disposition Plan: home hopefully tomorrow   Consultants:  Dr. Lovell Sheehan general surgery  Procedures:  none  Antibiotics: cipro 06/03/13>>  Flagyl 06/03/13>>   HPI/Subjective: Awake reports feeling much better. Slept well. Less pain. Ready for food  Objective: Filed Vitals:   06/05/13 0537 06/05/13 1402 06/05/13 2033 06/06/13 0438  BP: 114/79 129/76 112/71 120/71  Pulse: 75 64 66 55  Temp: 98.8 F (37.1 C) 99 F (37.2 C) 98.6 F (37 C) 98.2 F (36.8 C)  TempSrc: Oral Oral Oral Oral  Resp: 20 18 18 18   Height:      Weight:      SpO2: 96% 99% 100% 99%    Intake/Output Summary (Last 24 hours) at 06/06/13 1050 Last data filed at 06/06/13 3875  Gross per 24 hour  Intake 5290.41 ml  Output      0 ml  Net 5290.41 ml   Filed Weights   06/03/13 1528 06/03/13 1943  Weight: 81.647 kg (180 lb) 89.268 kg (196 lb 12.8 oz)    Exam:   General:  NAD  Cardiovascular: RRR No MGR No  LE edema  Respiratory: normal effort BS clear bilaterally no wheeze  Abdomen: less distention, more active BS, less tenderness to palpation. No rigidity  Musculoskeletal: no clubbing no cyanosis   Data Reviewed: Basic Metabolic Panel:  Recent Labs Lab 06/03/13 1545 06/04/13 0522 06/05/13 0603 06/06/13 0541  NA 135 137 133* 136  K 3.8 3.6 3.8 3.8  CL 98 101 100 103  CO2 30 26 24 24   GLUCOSE 140* 96 87 80  BUN 16 15 14 13   CREATININE 1.27 1.10 1.05 1.05  CALCIUM 9.6 8.8 8.8 8.8   Liver Function Tests:  Recent Labs Lab 06/03/13 1545 06/04/13 0522  AST 17 14  ALT 24 21  ALKPHOS 77 65  BILITOT 1.0 0.9  PROT 7.5 6.6  ALBUMIN 3.9 3.4*    Recent Labs Lab 06/03/13 1545  LIPASE 21   No results found for this basename: AMMONIA,  in the last 168 hours CBC:  Recent Labs Lab 06/03/13 1545 06/04/13 0522 06/05/13 0603 06/06/13 0541  WBC 16.8* 16.8* 19.3* 14.4*  NEUTROABS 12.9*  --   --   --   HGB 17.4* 15.6 15.8 15.1  HCT 49.6 44.8 45.6 43.8  MCV 96.5 95.9 96.0 96.1  PLT 272 289 300 297   Cardiac Enzymes: No results found for this basename: CKTOTAL, CKMB, CKMBINDEX, TROPONINI,  in the last 168 hours BNP (last 3 results) No results found for this basename: PROBNP,  in the last 8760 hours CBG: No results found for this basename:  GLUCAP,  in the last 168 hours  No results found for this or any previous visit (from the past 240 hour(s)).   Studies: Dg Abd 2 Views  06/05/2013   *RADIOLOGY REPORT*  Clinical Data: Pattern, abdominal pain, distention, diverticulitis with sigmoid microperforation  ABDOMEN - 2 VIEW  Comparison: CT abdomen and pelvis 06/03/2013  Findings: Small amount of retained contrast within a nondistended colon. Nonobstructive bowel gas pattern. No bowel dilatation bowel wall thickening or free intraperitoneal air. Bones unremarkable. No gross urinary tract calcification.  IMPRESSION: No acute abnormalities.   Original Report Authenticated By: Ulyses Southward, M.D.    Scheduled Meds: . ciprofloxacin  400 mg Intravenous Q12H  . enoxaparin (LOVENOX) injection  40 mg Subcutaneous Q24H  . metronidazole  500 mg Intravenous Q8H   Continuous Infusions: . sodium chloride 125 mL/hr at 06/06/13 0448    Principal Problem:   Diverticulitis Active Problems:   Leukocytosis, unspecified   Abdominal pain, unspecified site   Nausea alone   Diarrhea   Hyponatremia    Time spent: 30 minutes    [facility] M  [facility] Pager [phone]. If 7PM-7AM, please contact night-coverage at [email or web address], password [facility] 06/06/2013, 10:50 AM  LOS: 3 days   Attending: Patient seen and examined. Patient is clearly improved. Appreciate surgical input. We'll keep in the hospital today and consider discharge tomorrow depending on clinical status

## 2013-06-06 NOTE — Care Management Note (Signed)
    Page 1 of 1   06/07/2013     12:09:07 PM   CARE MANAGEMENT NOTE 06/07/2013  Patient:  Caleb Clements, Caleb Clements   Account Number:  [id number]  Date Initiated:  06/06/2013  Documentation initiated by:  Rosemary Holms  Subjective/Objective Assessment:   Pt admitted from home where he lives with his wife. Self employed. 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 Pt states he anticipates DC tomorrow. No HH needs anticipated     Action/Plan:   Anticipated DC Date:  06/07/2013   Anticipated DC Plan:  HOME/SELF CARE      DC Planning Services  CM consult      Choice offered to / List presented to:             Status of service:  Completed, signed off Medicare Important Message given?   (If response is "NO", the following Medicare IM given date fields will be blank) Date Medicare IM given:   Date Additional Medicare IM given:    Discharge Disposition:  HOME/SELF CARE  Per UR Regulation:    If discussed at Long Length of Stay Meetings, dates discussed:    Comments:  06/06/13 1430 Major Santerre Leanord Hawking RN BSN CM

## 2013-06-06 NOTE — Progress Notes (Signed)
  Subjective: Significant decrease in abdominal pain. No nausea noted. Has had small formed bowel movements.  Objective: Vital signs in last 24 hours: Temp:  [98.2 F (36.8 C)-99 F (37.2 C)] 98.2 F (36.8 C) (07/09 0438) Pulse Rate:  [55-66] 55 (07/09 0438) Resp:  [18] 18 (07/09 0438) BP: (112-129)/(71-76) 120/71 mmHg (07/09 0438) SpO2:  [99 %-100 %] 99 % (07/09 0438) Last BM Date: 06/11/2013  Intake/Output from previous day: 2023/06/12 0701 - 07/09 0700 In: 5290.4 [P.O.:480; I.V.:3810.4; IV Piggyback:1000] Out: -  Intake/Output this shift:    General appearance: alert, cooperative and no distress GI: Soft. Active bowel sounds appreciated. Minimal left lower quadrant abdominal pain to palpation noted. No rigidity noted. Less distention noted.  Lab Results:   Recent Labs  06-11-13 0603 06/06/13 0541  WBC 19.3* 14.4*  HGB 15.8 15.1  HCT 45.6 43.8  PLT 300 297   BMET  Recent Labs  2013/06/11 0603 06/06/13 0541  NA 133* 136  K 3.8 3.8  CL 100 103  CO2 24 24  GLUCOSE 87 80  BUN 14 13  CREATININE 1.05 1.05  CALCIUM 8.8 8.8   PT/INR No results found for this basename: LABPROT, INR,  in the last 72 hours  Studies/Results: Dg Abd 2 Views  06-11-13   *RADIOLOGY REPORT*  Clinical Data: Pattern, abdominal pain, distention, diverticulitis with sigmoid microperforation  ABDOMEN - 2 VIEW  Comparison: CT abdomen and pelvis 06/03/2013  Findings: Small amount of retained contrast within a nondistended colon. Nonobstructive bowel gas pattern. No bowel dilatation bowel wall thickening or free intraperitoneal air. Bones unremarkable. No gross urinary tract calcification.  IMPRESSION: No acute abnormalities.   Original Report Authenticated By: Ulyses Southward, M.D.    Anti-infectives: Anti-infectives   Start     Dose/Rate Route Frequency Ordered Stop   06/04/13 0700  ciprofloxacin (CIPRO) IVPB 400 mg     400 mg 200 mL/hr over 60 Minutes Intravenous Every 12 hours 06/03/13 2037     06/03/13 2045  metroNIDAZOLE (FLAGYL) IVPB 500 mg     500 mg 100 mL/hr over 60 Minutes Intravenous Every 8 hours 06/03/13 2037     06/03/13 1915  ciprofloxacin (CIPRO) IVPB 400 mg     400 mg 200 mL/hr over 60 Minutes Intravenous  Once 06/03/13 1906 06/03/13 2013   06/03/13 1915  metroNIDAZOLE (FLAGYL) IVPB 500 mg  Status:  Discontinued     500 mg 100 mL/hr over 60 Minutes Intravenous  Once 06/03/13 1906 06/03/13 2037      Assessment/Plan: Impression: Sigmoid diverticulitis, resolving. Leukocytosis improving. Bowel function has returned. Plan: Caleb Clements to soft diet. Anticipate discharge in next 24-48 hours.  LOS: 3 days    [name] A 06/06/2013

## 2013-06-07 LAB — CBC
MCH: 33.1 pg (ref 26.0–34.0)
MCV: 94.1 fL (ref 78.0–100.0)
Platelets: 324 10*3/uL (ref 150–400)
RDW: 12.4 % (ref 11.5–15.5)

## 2013-06-07 MED ORDER — METRONIDAZOLE 500 MG PO TABS
500.0000 mg | ORAL_TABLET | Freq: Three times a day (TID) | ORAL | Status: DC
  Administered 2013-06-07: 500 mg via ORAL
  Filled 2013-06-07: qty 1

## 2013-06-07 MED ORDER — CIPROFLOXACIN HCL 500 MG PO TABS: 500.0000 mg | ORAL_TABLET | Freq: Two times a day (BID) | ORAL | Status: AC

## 2013-06-07 MED ORDER — METRONIDAZOLE 500 MG PO TABS: 500.0000 mg | ORAL_TABLET | Freq: Three times a day (TID) | ORAL | Status: AC

## 2013-06-07 MED ORDER — HYDROCODONE-ACETAMINOPHEN 5-325 MG PO TABS: 1.0000 | ORAL_TABLET | Freq: Four times a day (QID) | ORAL | Status: DC | PRN

## 2013-06-07 MED ORDER — CIPROFLOXACIN HCL 250 MG PO TABS
500.0000 mg | ORAL_TABLET | Freq: Two times a day (BID) | ORAL | Status: DC
  Administered 2013-06-07: 500 mg via ORAL
  Filled 2013-06-07: qty 2

## 2013-06-07 MED ORDER — NICOTINE 21 MG/24HR TD PT24: 1.0000 | MEDICATED_PATCH | Freq: Every day | TRANSDERMAL | Status: DC

## 2013-06-07 NOTE — Discharge Summary (Signed)
Physician Discharge Summary  Ormond Scherer NWG:[id number] DOB: 07-09-1976 DOA: 06/03/2013  PCP: No primary provider on file.  Admit date: 06/03/2013 Discharge date: 06/07/2013  Time spent: 40 minutes  Recommendations for Outpatient Follow-up:  1. Follow up with Dr Lovell Sheehan in 2 weeks for evaluation of symptoms 2. Complete antibiotics as directed  Discharge Diagnoses:  Principal Problem:   Diverticulitis Active Problems:   Leukocytosis, unspecified   Abdominal pain, unspecified site   Nausea alone   Diarrhea   Hyponatremia   Tobacco use disorder   Discharge Condition: stable  Diet recommendation: regular  Filed Weights   06/03/13 1528 06/03/13 1943  Weight: 81.647 kg (180 lb) 89.268 kg (196 lb 12.8 oz)    History of present illness:   37 year-old male with no significant past medical history came to the ED on 06/03/13 after patient started abdominal pain for 2 days prior. Patient said that on July 4 he ate sausage, and after that patient started having abdominal pain which was in the whole abdomen, he also admited to having some nausea but no vomiting. The morning of 06/03/13 patient started shivering and had worsening of the pain. The day prior patient took medications for diarrhea over-the-counter along with Pepto-Bismol. The morning of admission patient had black colored runny diarrhea. He denied chest pain or shortness of breath. No dysuria urgency frequency of urination   Hospital Course:  Diverticulitis: CT abdomen notes sigmoid diverticulitis with concerns for microperforation.Lipase with in the limits of normal.  Admitted to floor, provided pain medicine and ant-emetic with clear liquids. cipro and flagyl initiated.  Started improving 06/05/13. Seen by Dr. Lovell Sheehan with general surgery who opined no surgical intervention needed.  Diet advanced 06/06/13. At discharge patient is afebrile, WC trending downward and tolerating regular diet. He is having normal stools. Stool studies neg for  cdiff.   Appreciate surgical consult. At discharge patient will continue cipro and flagy for 10 more days to complete 14 day course. Follow up with Dr. Lovell Sheehan in 2 weeks for evaluation of symptoms  Active Problems:  Leukocytosis, unspecified: trending down at discharge. Pt remained afebrile during hospitalization. Continue cipro/flagyl as above.   Abdominal pain, unspecified site: Mostly resolved at discharge.   Nausea: resolved at discharge.   Diarrhea: one episode since admission. cdiff negative. Formed stools at discharge   Hyponatremia: Mild. Resolved.   Tobacco use: provided with nicotene patch. Counseled to smoking cessation  Procedures: none Consultations:  Dr. Lovell Sheehan with general surgery  Discharge Exam: Filed Vitals:   06/06/13 0438 06/06/13 1430 06/06/13 2240 06/07/13 0643  BP: 120/71 127/83 114/61 115/64  Pulse: 55 64 51 51  Temp: 98.2 F (36.8 C) 98.3 F (36.8 C) 98.6 F (37 C) 97.8 F (36.6 C)  TempSrc: Oral Oral Oral Oral  Resp: 18 18 18 18   Height:      Weight:      SpO2: 99% 97% 99% 100%    General: well nourished NAD Cardiovascular: RRR No MGR No LE edema Respiratory: normal effort BS clear to auscultation bilaterally no wheeze Abdomen : soft +BS non-tender to palpation. Non distended. No rigidity  Discharge Instructions     Medication List         ciprofloxacin 500 MG tablet  Commonly known as:  CIPRO  Take 1 tablet (500 mg total) by mouth 2 (two) times daily.     HYDROcodone-acetaminophen 5-325 MG per tablet  Commonly known as:  NORCO  Take 1 tablet by mouth every 6 (six) hours  as needed for pain.     metroNIDAZOLE 500 MG tablet  Commonly known as:  FLAGYL  Take 1 tablet (500 mg total) by mouth every 8 (eight) hours.     nicotine 21 mg/24hr patch  Commonly known as:  NICODERM CQ - dosed in mg/24 hours  Place 1 patch onto the skin daily.       No Known Allergies     Follow-up Information   Follow up with Franky Macho A, MD.  Schedule an appointment as soon as possible for a visit in 2 weeks. (evaluation of symptoms)    Contact information:   [address] [facility] [address] [phone]        The results of significant diagnostics from this hospitalization (including imaging, microbiology, ancillary and laboratory) are listed below for reference.    Significant Diagnostic Studies: Ct Abdomen Pelvis W Contrast  06/03/2013   *RADIOLOGY REPORT*  Clinical Data: Abdominal pain, nausea, hematemesis  CT ABDOMEN AND PELVIS WITH CONTRAST  Technique:  Multidetector CT imaging of the abdomen and pelvis was performed following the standard protocol during bolus administration of intravenous contrast.  Contrast: Omnipaque-300 IV  Comparison: None.  Findings: Mild dependent atelectasis lung bases.  Liver, spleen, pancreas, and adrenal glands are within normal limits.  Gallbladder is unremarkable.  No intrahepatic or extrahepatic ductal dilatation.  9 mm right upper pole renal cyst (series 2/image 3).  Left kidney is within normal limits.  No hydronephrosis.  No evidence of bowel obstruction.  Normal appendix.  Wall thickening/inflammatory changes along the proximal sigmoid colon (series 2/image 77), reflecting sigmoid diverticulitis.  A few scattered tiny foci of extraluminal gas anteriorly (series 2/images 76-77), compatible with microperforation. No drainable fluid collection/abscess.  No free air.  No evidence of abdominal aortic aneurysm.  No abdominopelvic ascites.  No suspicious abdominopelvic lymphadenopathy.  Prostate is unremarkable.  Bladder is within normal limits.  Visualized osseous structures are within normal limits.  IMPRESSION: Sigmoid diverticulitis.  Tiny foci of localized extraluminal gas, suggesting microperforation.  No drainable fluid collection/abscess.  No free air.   Original Report Authenticated By: Charline Bills, M.D.   Dg Abd 2 Views  06/05/2013   *RADIOLOGY REPORT*  Clinical Data: Pattern,  abdominal pain, distention, diverticulitis with sigmoid microperforation  ABDOMEN - 2 VIEW  Comparison: CT abdomen and pelvis 06/03/2013  Findings: Small amount of retained contrast within a nondistended colon. Nonobstructive bowel gas pattern. No bowel dilatation bowel wall thickening or free intraperitoneal air. Bones unremarkable. No gross urinary tract calcification.  IMPRESSION: No acute abnormalities.   Original Report Authenticated By: Ulyses Southward, M.D.    Microbiology: Recent Results (from the past 240 hour(s))  CLOSTRIDIUM DIFFICILE BY PCR     Status: None   Collection Time    06/06/13  5:56 PM      Result Value Range Status   C difficile by pcr NEGATIVE  NEGATIVE Final     Labs: Basic Metabolic Panel:  Recent Labs Lab 06/03/13 1545 06/04/13 0522 06/05/13 0603 06/06/13 0541  NA 135 137 133* 136  K 3.8 3.6 3.8 3.8  CL 98 101 100 103  CO2 30 26 24 24   GLUCOSE 140* 96 87 80  BUN 16 15 14 13   CREATININE 1.27 1.10 1.05 1.05  CALCIUM 9.6 8.8 8.8 8.8   Liver Function Tests:  Recent Labs Lab 06/03/13 1545 06/04/13 0522  AST 17 14  ALT 24 21  ALKPHOS 77 65  BILITOT 1.0 0.9  PROT 7.5 6.6  ALBUMIN  3.9 3.4*    Recent Labs Lab 06/03/13 1545  LIPASE 21   No results found for this basename: AMMONIA,  in the last 168 hours CBC:  Recent Labs Lab 06/03/13 1545 06/04/13 0522 06/05/13 0603 06/06/13 0541 06/07/13 0634  WBC 16.8* 16.8* 19.3* 14.4* 13.3*  NEUTROABS 12.9*  --   --   --   --   HGB 17.4* 15.6 15.8 15.1 15.8  HCT 49.6 44.8 45.6 43.8 44.9  MCV 96.5 95.9 96.0 96.1 94.1  PLT 272 289 300 297 324        Signed:  BLACK,KAREN M  [facility] 06/07/2013, 9:36 AM Attending: Patient seen and examined. The above note reviewed. Patient is clearly stable for discharge. His abdomen is soft and nontender. He does not have a resting tachycardia. There is no fever. Bowel sounds are heard.

## 2013-06-07 NOTE — Progress Notes (Signed)
Patient states understanding of discharge, prescriptions given 

## 2014-12-24 ENCOUNTER — Encounter (HOSPITAL_COMMUNITY): Payer: Self-pay | Admitting: Emergency Medicine

## 2014-12-24 ENCOUNTER — Emergency Department (HOSPITAL_COMMUNITY)
Admission: EM | Admit: 2014-12-24 | Discharge: 2014-12-25 | Disposition: A | Discharge status: Home / Self Care | Payer: Self-pay | Attending: Emergency Medicine | Admitting: Emergency Medicine

## 2014-12-24 ENCOUNTER — Emergency Department (HOSPITAL_COMMUNITY): Payer: Self-pay

## 2014-12-24 DIAGNOSIS — R109 Unspecified abdominal pain: Secondary | ICD-10-CM

## 2014-12-24 DIAGNOSIS — N200 Calculus of kidney: Secondary | ICD-10-CM | POA: Insufficient documentation

## 2014-12-24 DIAGNOSIS — Z8719 Personal history of other diseases of the digestive system: Secondary | ICD-10-CM | POA: Insufficient documentation

## 2014-12-24 DIAGNOSIS — Z720 Tobacco use: Secondary | ICD-10-CM | POA: Insufficient documentation

## 2014-12-24 DIAGNOSIS — Z79899 Other long term (current) drug therapy: Secondary | ICD-10-CM | POA: Insufficient documentation

## 2014-12-24 HISTORY — DX: Diverticulitis of intestine, part unspecified, without perforation or abscess without bleeding: K57.92

## 2014-12-24 LAB — COMPREHENSIVE METABOLIC PANEL
ALK PHOS: 72 U/L (ref 39–117)
ALT: 34 U/L (ref 0–53)
AST: 28 U/L (ref 0–37)
Albumin: 4.8 g/dL (ref 3.5–5.2)
Anion gap: 11 (ref 5–15)
BILIRUBIN TOTAL: 0.8 mg/dL (ref 0.3–1.2)
BUN: 15 mg/dL (ref 6–23)
CHLORIDE: 105 mmol/L (ref 96–112)
CO2: 23 mmol/L (ref 19–32)
CREATININE: 1.43 mg/dL — AB (ref 0.50–1.35)
Calcium: 10 mg/dL (ref 8.4–10.5)
GFR calc Af Amer: 71 mL/min — ABNORMAL LOW (ref >90)
GFR calc non Af Amer: 61 mL/min — ABNORMAL LOW (ref >90)
Glucose, Bld: 150 mg/dL — ABNORMAL HIGH (ref 70–99)
Potassium: 3.6 mmol/L (ref 3.5–5.1)
Sodium: 139 mmol/L (ref 135–145)
TOTAL PROTEIN: 7.8 g/dL (ref 6.0–8.3)

## 2014-12-24 LAB — CBC WITH DIFFERENTIAL/PLATELET
Basophils Absolute: 0 10*3/uL (ref 0.0–0.1)
Basophils Relative: 0 % (ref 0–1)
EOS PCT: 2 % (ref 0–5)
Eosinophils Absolute: 0.4 10*3/uL (ref 0.0–0.7)
HEMATOCRIT: 53 % — AB (ref 39.0–52.0)
HEMOGLOBIN: 18.6 g/dL — AB (ref 13.0–17.0)
LYMPHS ABS: 3.7 10*3/uL (ref 0.7–4.0)
Lymphocytes Relative: 21 % (ref 12–46)
MCH: 34.1 pg — ABNORMAL HIGH (ref 26.0–34.0)
MCHC: 35.1 g/dL (ref 30.0–36.0)
MCV: 97.2 fL (ref 78.0–100.0)
MONO ABS: 1.4 10*3/uL — AB (ref 0.1–1.0)
Monocytes Relative: 8 % (ref 3–12)
Neutro Abs: 12 10*3/uL — ABNORMAL HIGH (ref 1.7–7.7)
Neutrophils Relative %: 69 % (ref 43–77)
PLATELETS: 293 10*3/uL (ref 150–400)
RBC: 5.45 MIL/uL (ref 4.22–5.81)
RDW: 12.7 % (ref 11.5–15.5)
WBC: 17.5 10*3/uL — ABNORMAL HIGH (ref 4.0–10.5)

## 2014-12-24 LAB — URINALYSIS, ROUTINE W REFLEX MICROSCOPIC
GLUCOSE, UA: NEGATIVE mg/dL
Ketones, ur: NEGATIVE mg/dL
Leukocytes, UA: NEGATIVE
Nitrite: NEGATIVE
Protein, ur: 100 mg/dL — AB
SPECIFIC GRAVITY, URINE: 1.02 (ref 1.005–1.030)
Urobilinogen, UA: 0.2 mg/dL (ref 0.0–1.0)
pH: 6 (ref 5.0–8.0)

## 2014-12-24 LAB — LIPASE, BLOOD: LIPASE: 22 U/L (ref 11–59)

## 2014-12-24 LAB — URINE MICROSCOPIC-ADD ON

## 2014-12-24 MED ORDER — HYDROMORPHONE HCL 1 MG/ML IJ SOLN
1.0000 mg | Freq: Once | INTRAMUSCULAR | Status: DC
Start: 2014-12-24 — End: 2014-12-24

## 2014-12-24 MED ORDER — ONDANSETRON HCL 4 MG/2ML IJ SOLN
4.0000 mg | Freq: Once | INTRAMUSCULAR | Status: AC
  Administered 2014-12-24: 4 mg via INTRAVENOUS
  Filled 2014-12-24: qty 2

## 2014-12-24 MED ORDER — FENTANYL CITRATE 0.05 MG/ML IJ SOLN
75.0000 ug | Freq: Once | INTRAMUSCULAR | Status: AC
  Administered 2014-12-24: 75 ug via INTRAVENOUS
  Filled 2014-12-24: qty 2

## 2014-12-24 MED ORDER — MORPHINE SULFATE 4 MG/ML IJ SOLN
4.0000 mg | Freq: Once | INTRAMUSCULAR | Status: AC
  Administered 2014-12-24: 4 mg via INTRAVENOUS
  Filled 2014-12-24: qty 1

## 2014-12-24 NOTE — ED Notes (Signed)
Patient reports right flank pain radiating into right lower abdomen. Nausea and vomiting. Actively vomiting in triage.

## 2014-12-24 NOTE — ED Notes (Addendum)
Object removed from urine sample, possible stone. Item was shown to PA & placed in specimen cup & given to pt.

## 2014-12-24 NOTE — ED Provider Notes (Signed)
CSN: [id number]     Arrival date & time 12/24/14  1931 History   First MD Initiated Contact with Patient 12/24/14 1943     Chief Complaint  Patient presents with  . Flank Pain     (Consider location/radiation/quality/duration/timing/severity/associated sxs/prior Treatment) Patient is a 39 y.o. male presenting with flank pain. The history is provided by the patient and the spouse.  Flank Pain This is a new problem. The current episode started today. The problem occurs constantly. The problem has been unchanged. Associated symptoms include abdominal pain, nausea, urinary symptoms and vomiting. Pertinent negatives include no arthralgias, chest pain, congestion, fever, headaches, joint swelling, neck pain, numbness, rash, sore throat or weakness. Associated symptoms comments: Pain radiates into the rlq.  Describes sudden onset of sharp flank pain.  Multiple episodes of nonbloody emesis, dark urine with painful urination since sx started an hour before arrival.  Denies personal h/o kidney stones.. Nothing aggravates the symptoms. He has tried nothing for the symptoms.    Past Medical History  Diagnosis Date  . Diverticulitis    History reviewed. No pertinent past surgical history. History reviewed. No pertinent family history. History  Substance Use Topics  . Smoking status: Current Every Day Smoker -- 1.00 packs/day for 17 years  . Smokeless tobacco: Current User  . Alcohol Use: No    Review of Systems  Constitutional: Negative for fever.  HENT: Negative for congestion and sore throat.   Eyes: Negative.   Respiratory: Negative for chest tightness and shortness of breath.   Cardiovascular: Negative for chest pain.  Gastrointestinal: Positive for nausea, vomiting and abdominal pain.  Genitourinary: Positive for dysuria, hematuria and flank pain.  Musculoskeletal: Negative for joint swelling, arthralgias and neck pain.  Skin: Negative.  Negative for rash and wound.  Neurological:  Negative for dizziness, weakness, light-headedness, numbness and headaches.  Psychiatric/Behavioral: Negative.       Allergies  Dilaudid and Propoxyphene  Home Medications   Prior to Admission medications   Medication Sig Start Date End Date Taking? Authorizing Provider  HYDROcodone-acetaminophen (NORCO) 5-325 MG per tablet Take 1 tablet by mouth every 6 (six) hours as needed for pain. Patient not taking: Reported on 12/24/2014 06/07/13   Gwenyth Bender, NP  nicotine (NICODERM CQ - DOSED IN MG/24 HOURS) 21 mg/24hr patch Place 1 patch onto the skin daily. Patient not taking: Reported on 12/24/2014 06/07/13   Gwenyth Bender, NP  ondansetron (ZOFRAN) 4 MG tablet Take 1 tablet (4 mg total) by mouth every 6 (six) hours. 12/25/14   Burgess Amor, PA-C  oxyCODONE-acetaminophen (PERCOCET/ROXICET) 5-325 MG per tablet Take 1 tablet by mouth every 4 (four) hours as needed. 12/25/14   Burgess Amor, PA-C   BP 93/61 mmHg  Pulse 62  Temp(Src) 98.4 F (36.9 C) (Oral)  Resp 12  Ht  (1.727 m)  Wt 195 lb (88.451 kg)  BMI 29.66 kg/m2  SpO2 94% Physical Exam  Constitutional: He appears well-developed and well-nourished.  Uncomfortable. Active emesis upon first entering dept.  HENT:  Head: Normocephalic and atraumatic.  Neck: Normal range of motion.  Cardiovascular: Normal rate, regular rhythm and normal heart sounds.   Pulmonary/Chest: Effort normal and breath sounds normal.  Abdominal: Soft. Bowel sounds are normal. There is no tenderness. There is CVA tenderness. There is no rigidity and no guarding.  Musculoskeletal: Normal range of motion.  Neurological: He is alert.  Skin: Skin is warm and dry.  Nursing note and vitals reviewed.   ED Course  Procedures (including critical care time) Labs Review Labs Reviewed  COMPREHENSIVE METABOLIC PANEL - Abnormal; Notable for the following:    Glucose, Bld 150 (*)    Creatinine, Ser 1.43 (*)    GFR calc non Af Amer 61 (*)    GFR calc Af Amer 71 (*)     All other components within normal limits  CBC WITH DIFFERENTIAL/PLATELET - Abnormal; Notable for the following:    WBC 17.5 (*)    Hemoglobin 18.6 (*)    HCT 53.0 (*)    MCH 34.1 (*)    Neutro Abs 12.0 (*)    Monocytes Absolute 1.4 (*)    All other components within normal limits  URINALYSIS, ROUTINE W REFLEX MICROSCOPIC - Abnormal; Notable for the following:    Color, Urine BROWN (*)    APPearance HAZY (*)    Hgb urine dipstick LARGE (*)    Bilirubin Urine SMALL (*)    Protein, ur 100 (*)    All other components within normal limits  URINE MICROSCOPIC-ADD ON - Abnormal; Notable for the following:    Bacteria, UA MANY (*)    All other components within normal limits  LIPASE, BLOOD    Imaging Review Ct Renal Stone Study  12/24/2014   CLINICAL DATA:  Right flank pain starting 18:30 today  EXAM: CT ABDOMEN AND PELVIS WITHOUT CONTRAST  TECHNIQUE: Multidetector CT imaging of the abdomen and pelvis was performed following the standard protocol without IV contrast.  COMPARISON:  CT scan 06/03/2013  FINDINGS: Sagittal images of the spine are unremarkable. There is a midline supraumbilical hernia containing fat measures about 2.2 cm best seen in sagittal image 59. A second midline ventral hernia containing fat just above the umbilical region measures 2.8 cm. There is no evidence of acute complication. Tiny umbilical hernia containing fat without evidence of acute complication.  Unenhanced liver shows no biliary ductal dilatation. No calcified gallstones are noted within gallbladder. Unenhanced pancreas spleen and adrenal glands are unremarkable.  There is mild right hydronephrosis and right hydroureter. No nephrolithiasis. No proximal or mid calcified ureteral calculi are noted bilaterally.  In axial image 75 there is a 4 mm calcified calculus in right posterior urinary bladder wall probable just beyond the right UVJ. Limited assessment of the urinary bladder which is empty.  No aortic aneurysm. No  small bowel obstruction. No ascites or free air. No adenopathy.  There is no pericecal inflammation. Normal retrocecal appendix clearly visualized axial image 35. Scattered diverticula are noted descending colon. No evidence of acute diverticulitis.  IMPRESSION: 1. There is mild right hydronephrosis and right hydroureter. 2. There is 4 mm calcified calculus in right posterior wall of urinary bladder probable just beyond or at the right UVJ. Limited assessment of the urinary bladder which is empty. 3. Normal retrocecal appendix.  No pericecal inflammation. 4. Small umbilical hernia containing omental fat. Two additional midline supraumbilical hernias containing fat without evidence of acute complication. 5. No small bowel obstruction.   Electronically Signed   By: Natasha Mead M.D.   On: 12/24/2014 21:17     EKG Interpretation None      MDM   Final diagnoses:  Acute flank pain  Kidney stone on right side    Patients labs and/or radiological studies were viewed and considered during the medical decision making and disposition process. Pt obtained relief of pain with fentanyl, no further emesis once zofran received.  While in ed, he passed a small stone with urine which was retained  for urology f/u.  Advised increased fluids, given NS per IV while here. Referral to [facility] for further care.  Bacteruria without leukocytes, neg nitrite.  Pending urine cx.    Burgess AmorJulie Bettyjane Shenoy, PA-C 12/25/14 1139  Benny LennertJoseph L Zammit, MD 12/25/14 [phone]

## 2014-12-25 MED ORDER — ONDANSETRON HCL 4 MG PO TABS: 4.0000 mg | ORAL_TABLET | Freq: Four times a day (QID) | ORAL | Status: DC

## 2014-12-25 MED ORDER — OXYCODONE-ACETAMINOPHEN 5-325 MG PO TABS: 1.0000 | ORAL_TABLET | ORAL | Status: DC | PRN

## 2014-12-25 NOTE — Discharge Instructions (Signed)

## 2014-12-25 NOTE — ED Notes (Signed)
Pt alert & oriented x4, stable gait. Patient given discharge instructions, paperwork & prescription(s). Patient  instructed to stop at the registration desk to finish any additional paperwork. Patient verbalized understanding. Pt left department w/ no further questions. 

## 2015-07-30 ENCOUNTER — Encounter: Payer: Self-pay | Admitting: *Deleted

## 2015-08-11 ENCOUNTER — Encounter: Payer: Self-pay | Admitting: General Surgery

## 2015-08-11 ENCOUNTER — Ambulatory Visit (INDEPENDENT_AMBULATORY_CARE_PROVIDER_SITE_OTHER): Payer: Commercial Indemnity | Admitting: General Surgery

## 2015-08-11 VITALS — BP 110/80 | HR 72 | Resp 16 | Ht 68.0 in | Wt 193.0 lb

## 2015-08-11 DIAGNOSIS — K439 Ventral hernia without obstruction or gangrene: Secondary | ICD-10-CM | POA: Diagnosis not present

## 2015-08-11 HISTORY — DX: Ventral hernia without obstruction or gangrene: K43.9

## 2015-08-11 NOTE — Patient Instructions (Addendum)
Hernia A hernia occurs when an internal organ pushes out through a weak spot in the abdominal wall. Hernias most commonly occur in the groin and around the navel. Hernias often can be pushed back into place (reduced). Most hernias tend to get worse over time. Some abdominal hernias can get stuck in the opening (irreducible or incarcerated hernia) and cannot be reduced. An irreducible abdominal hernia which is tightly squeezed into the opening is at risk for impaired blood supply (strangulated hernia). A strangulated hernia is a medical emergency. Because of the risk for an irreducible or strangulated hernia, surgery may be recommended to repair a hernia. CAUSES   Heavy lifting.  Prolonged coughing.  Straining to have a bowel movement.  A cut (incision) made during an abdominal surgery. HOME CARE INSTRUCTIONS   Bed rest is not required. You may continue your normal activities.  Avoid lifting more than 10 pounds (4.5 kg) or straining.  Cough gently. If you are a smoker it is best to stop. Even the best hernia repair can break down with the continual strain of coughing. Even if you do not have your hernia repaired, a cough will continue to aggravate the problem.  Do not wear anything tight over your hernia. Do not try to keep it in with an outside bandage or truss. These can damage abdominal contents if they are trapped within the hernia sac.  Eat a normal diet.  Avoid constipation. Straining over long periods of time will increase hernia size and encourage breakdown of repairs. If you cannot do this with diet alone, stool softeners may be used. SEEK IMMEDIATE MEDICAL CARE IF:   You have a fever.  You develop increasing abdominal pain.  You feel nauseous or vomit.  Your hernia is stuck outside the abdomen, looks discolored, feels hard, or is tender.  You have any changes in your bowel habits or in the hernia that are unusual for you.  You have increased pain or swelling around the  hernia.  You cannot push the hernia back in place by applying gentle pressure while lying down. MAKE SURE YOU:   Understand these instructions.  Will watch your condition.  Will get help right away if you are not doing well or get worse. Document Released: 11/15/2005 Document Revised: 02/07/2012 Document Reviewed: 07/04/2008 [facility] Patient Information 2015 [place], [place]. This information is not intended to replace advice given to you by your health care provider. Make sure you discuss any questions you have with your health care provider.  Patient's surgery has been scheduled for 08-20-15 at [facility].

## 2015-08-11 NOTE — H&P (Signed)
Patient ID: Caleb Salles., male   DOB: July 09, 1976, 39 y.o.   MRN: [id number]  Chief Complaint  Patient presents with  . Umbilical Hernia    ABD "Knot" has been there for ~30months.    HPI Caleb Clements. is a 39 y.o. male.  Knot in abdomen, possible umbilical hernia, no pain, no constipation. Has been there for ~8 months. He states that it has enlarged. He is here today with his mother.  HPI  Past Medical History  Diagnosis Date  . Diverticulitis July 2014    Suggestion of microperforation on CT.    History reviewed. No pertinent past surgical history.  Family History  Problem Relation Age of Onset  . Diabetes Father     Social History Social History  Substance Use Topics  . Smoking status: Current Every Day Smoker -- 1.00 packs/day for 17 years  . Smokeless tobacco: Current User  . Alcohol Use: No    Allergies  Allergen Reactions  . Dilaudid [Hydromorphone Hcl] Shortness Of Breath  . Propoxyphene Shortness Of Breath, Nausea And Vomiting and Rash    Patient has trouble recalling full reaction.    No current outpatient prescriptions on file.   No current facility-administered medications for this visit.    Review of Systems Review of Systems  Constitutional: Negative.   Respiratory: Negative.   Gastrointestinal: Negative.     Blood pressure 110/80, pulse 72, resp. rate 16, height  (1.727 m), weight 193 lb (87.544 kg).  Physical Exam Physical Exam  Constitutional: He is oriented to person, place, and time. He appears well-developed and well-nourished.  Eyes: Conjunctivae are normal. No scleral icterus.  Neck: Neck supple.  Cardiovascular: Normal rate, regular rhythm and normal heart sounds.   Pulmonary/Chest: Effort normal and breath sounds normal.  Abdominal: Soft. Normal appearance. A hernia (1 cm defect 4 cm above the umbilicus ) is present.  Lymphadenopathy:    He has no cervical adenopathy.  Neurological: He is alert and oriented to  person, place, and time.  Skin: Skin is warm and dry.  Psychiatric: He has a normal mood and affect.    Data Reviewed PCP notes of 07/30/2015.  Assessment    Enlarging epigastric hernia.    Plan    The role for elective hernia repair was reviewed.    Hernia precautions and incarceration were discussed with the patient. If they develop symptoms of an incarcerated hernia, they were encouraged to seek prompt medical attention. Possible use of mesh if the defect is significantly larger than what is estimated on today's exam was discussed. The risk of infection was reviewed. The role of prosthetic mesh to minimize the risk of recurrence was reviewed.  Patient's surgery has been scheduled for 08-20-15 at [facility].   Ref, Larna Daughters PCP - none   Earline Mayotte

## 2015-08-11 NOTE — Progress Notes (Signed)
Patient ID: Inocente Salles., male   DOB: 13-Jun-1976, 39 y.o.   MRN: [id number]  Chief Complaint  Patient presents with  . Umbilical Hernia    ABD "Knot" has been there for ~66months.    HPI Mordechai Matuszak. is a 39 y.o. male.  Knot in abdomen, possible umbilical hernia, no pain, no constipation. Has been there for ~8 months. He states that it has enlarged. He is here today with his mother.  HPI  Past Medical History  Diagnosis Date  . Diverticulitis July 2014    Suggestion of microperforation on CT.    History reviewed. No pertinent past surgical history.  Family History  Problem Relation Age of Onset  . Diabetes Father     Social History Social History  Substance Use Topics  . Smoking status: Current Every Day Smoker -- 1.00 packs/day for 17 years  . Smokeless tobacco: Current User  . Alcohol Use: No    Allergies  Allergen Reactions  . Dilaudid [Hydromorphone Hcl] Shortness Of Breath  . Propoxyphene Shortness Of Breath, Nausea And Vomiting and Rash    Patient has trouble recalling full reaction.    No current outpatient prescriptions on file.   No current facility-administered medications for this visit.    Review of Systems Review of Systems  Constitutional: Negative.   Respiratory: Negative.   Gastrointestinal: Negative.     Blood pressure 110/80, pulse 72, resp. rate 16, height  (1.727 m), weight 193 lb (87.544 kg).  Physical Exam Physical Exam  Constitutional: He is oriented to person, place, and time. He appears well-developed and well-nourished.  Eyes: Conjunctivae are normal. No scleral icterus.  Neck: Neck supple.  Cardiovascular: Normal rate, regular rhythm and normal heart sounds.   Pulmonary/Chest: Effort normal and breath sounds normal.  Abdominal: Soft. Normal appearance. A hernia (1 cm defect 4 cm above the umbilicus ) is present.  Lymphadenopathy:    He has no cervical adenopathy.  Neurological: He is alert and oriented to  person, place, and time.  Skin: Skin is warm and dry.  Psychiatric: He has a normal mood and affect.    Data Reviewed PCP notes of 07/30/2015.  Assessment    Enlarging epigastric hernia.    Plan    The role for elective hernia repair was reviewed.    Hernia precautions and incarceration were discussed with the patient. If they develop symptoms of an incarcerated hernia, they were encouraged to seek prompt medical attention. Possible use of mesh if the defect is significantly larger than what is estimated on today's exam was discussed. The risk of infection was reviewed. The role of prosthetic mesh to minimize the risk of recurrence was reviewed.  Patient's surgery has been scheduled for 08-20-15 at [facility].   Ref, Larna Daughters PCP - none   Earline Mayotte 08/11/2015, 9:33 PM

## 2015-08-13 ENCOUNTER — Other Ambulatory Visit: Payer: Self-pay

## 2015-08-13 ENCOUNTER — Encounter: Payer: Self-pay | Admitting: *Deleted

## 2015-08-13 NOTE — Patient Instructions (Signed)
  Your procedure is scheduled on: 08-21-15 Report to [facility] 2ND FLOOR To find out your arrival time please call [phone] between 1PM - 3PM on 08-19-15  Remember: Instructions that are not followed completely may result in serious medical risk, up to and including death, or upon the discretion of your surgeon and anesthesiologist your surgery may need to be rescheduled.    _X___ 1. Do not eat food or drink liquids after midnight. No gum chewing or hard candies.     _X___ 2. No Alcohol for 24 hours before or after surgery.   ____ 3. Bring all medications with you on the day of surgery if instructed.    ____ 4. Notify your doctor if there is any change in your medical condition     (cold, fever, infections).     Do not wear jewelry, make-up, hairpins, clips or nail polish.  Do not wear lotions, powders, or perfumes. You may wear deodorant.  Do not shave 48 hours prior to surgery. Men may shave face and neck.  Do not bring valuables to the hospital.    [facility] is not responsible for any belongings or valuables.               Contacts, dentures or bridgework may not be worn into surgery.  Leave your suitcase in the car. After surgery it may be brought to your room.  For patients admitted to the hospital, discharge time is determined by your  treatment team.   Patients discharged the day of surgery will not be allowed to drive home.   Please read over the following fact sheets that you were given:     ____ Take these medicines the morning of surgery with A SIP OF WATER:    1. NONE  2.   3.   4.  5.  6.  ____ Fleet Enema (as directed)   ____ Use CHG Soap as directed  ____ Use inhalers on the day of surgery  ____ Stop metformin 2 days prior to surgery    ____ Take 1/2 of usual insulin dose the night before surgery and none on the morning of surgery.   ____ Stop Coumadin/Plavix/aspirin-N/A  ____ Stop Anti-inflammatories-NO NSAIDS OR ASA  PRODUCTS-TYLENOL OK   ____ Stop supplements until after surgery.    ____ Bring C-Pap to the hospital.

## 2015-08-20 ENCOUNTER — Ambulatory Visit: Payer: Managed Care, Other (non HMO) | Admitting: Anesthesiology

## 2015-08-20 ENCOUNTER — Ambulatory Visit
Admission: RE | Admit: 2015-08-20 | Discharge: 2015-08-20 | Disposition: A | Discharge status: Home / Self Care | Payer: Managed Care, Other (non HMO) | Source: Ambulatory Visit | Attending: General Surgery | Admitting: General Surgery

## 2015-08-20 ENCOUNTER — Encounter
Admission: RE | Disposition: A | Discharge status: Home / Self Care | Payer: Self-pay | Source: Ambulatory Visit | Attending: General Surgery

## 2015-08-20 ENCOUNTER — Encounter: Payer: Self-pay | Admitting: *Deleted

## 2015-08-20 DIAGNOSIS — K429 Umbilical hernia without obstruction or gangrene: Secondary | ICD-10-CM | POA: Insufficient documentation

## 2015-08-20 DIAGNOSIS — F17210 Nicotine dependence, cigarettes, uncomplicated: Secondary | ICD-10-CM | POA: Diagnosis not present

## 2015-08-20 DIAGNOSIS — Z87442 Personal history of urinary calculi: Secondary | ICD-10-CM | POA: Insufficient documentation

## 2015-08-20 DIAGNOSIS — Z888 Allergy status to other drugs, medicaments and biological substances status: Secondary | ICD-10-CM | POA: Insufficient documentation

## 2015-08-20 DIAGNOSIS — Z885 Allergy status to narcotic agent status: Secondary | ICD-10-CM | POA: Diagnosis not present

## 2015-08-20 DIAGNOSIS — K439 Ventral hernia without obstruction or gangrene: Secondary | ICD-10-CM | POA: Diagnosis not present

## 2015-08-20 HISTORY — PX: HERNIA REPAIR: SHX51

## 2015-08-20 HISTORY — DX: Chronic kidney disease, unspecified: N18.9

## 2015-08-20 HISTORY — PX: EPIGASTRIC HERNIA REPAIR: SHX404

## 2015-08-20 SURGERY — REPAIR, HERNIA, EPIGASTRIC, ADULT
Anesthesia: General | Wound class: Clean

## 2015-08-20 MED ORDER — CEFAZOLIN SODIUM-DEXTROSE 2-3 GM-% IV SOLR
INTRAVENOUS | Status: AC
  Filled 2015-08-20: qty 50

## 2015-08-20 MED ORDER — SODIUM BICARBONATE 4 % IV SOLN
INTRAVENOUS | Status: AC
Start: 2015-08-20 — End: 2015-08-20
  Filled 2015-08-20: qty 5

## 2015-08-20 MED ORDER — CEFAZOLIN SODIUM-DEXTROSE 2-3 GM-% IV SOLR
2.0000 g | Freq: Once | INTRAVENOUS | Status: AC
  Administered 2015-08-20: 2 g via INTRAVENOUS

## 2015-08-20 MED ORDER — DEXAMETHASONE SODIUM PHOSPHATE 4 MG/ML IJ SOLN
INTRAMUSCULAR | Status: DC | PRN
  Administered 2015-08-20: 10 mg via INTRAVENOUS

## 2015-08-20 MED ORDER — ACETAMINOPHEN 10 MG/ML IV SOLN
INTRAVENOUS | Status: AC
  Filled 2015-08-20: qty 100

## 2015-08-20 MED ORDER — FENTANYL CITRATE (PF) 100 MCG/2ML IJ SOLN
INTRAMUSCULAR | Status: DC | PRN
  Administered 2015-08-20 (×2): 50 ug via INTRAVENOUS

## 2015-08-20 MED ORDER — PROPOFOL 10 MG/ML IV BOLUS
INTRAVENOUS | Status: DC | PRN
  Administered 2015-08-20: 200 mg via INTRAVENOUS

## 2015-08-20 MED ORDER — BUPIVACAINE HCL (PF) 0.5 % IJ SOLN
INTRAMUSCULAR | Status: AC
  Filled 2015-08-20: qty 30

## 2015-08-20 MED ORDER — LACTATED RINGERS IV SOLN
INTRAVENOUS | Status: DC
  Administered 2015-08-20: 13:00:00 via INTRAVENOUS

## 2015-08-20 MED ORDER — LIDOCAINE HCL (CARDIAC) 20 MG/ML IV SOLN
INTRAVENOUS | Status: DC | PRN
  Administered 2015-08-20: 100 mg via INTRAVENOUS

## 2015-08-20 MED ORDER — BUPIVACAINE-EPINEPHRINE 0.5% -1:200000 IJ SOLN
INTRAMUSCULAR | Status: DC | PRN
  Administered 2015-08-20: 30 mL

## 2015-08-20 MED ORDER — FENTANYL CITRATE (PF) 100 MCG/2ML IJ SOLN
INTRAMUSCULAR | Status: AC
  Filled 2015-08-20: qty 2

## 2015-08-20 MED ORDER — ACETAMINOPHEN 10 MG/ML IV SOLN
INTRAVENOUS | Status: DC | PRN
  Administered 2015-08-20: 1000 mg via INTRAVENOUS

## 2015-08-20 MED ORDER — ONDANSETRON HCL 4 MG/2ML IJ SOLN: 4.0000 mg | Freq: Once | INTRAMUSCULAR | Status: DC | PRN

## 2015-08-20 MED ORDER — FENTANYL CITRATE (PF) 100 MCG/2ML IJ SOLN
25.0000 ug | INTRAMUSCULAR | Status: AC | PRN
  Administered 2015-08-20 (×6): 25 ug via INTRAVENOUS

## 2015-08-20 MED ORDER — BUPIVACAINE-EPINEPHRINE (PF) 0.5% -1:200000 IJ SOLN
INTRAMUSCULAR | Status: AC
  Filled 2015-08-20: qty 30

## 2015-08-20 MED ORDER — LIDOCAINE HCL (PF) 1 % IJ SOLN
INTRAMUSCULAR | Status: AC
  Filled 2015-08-20: qty 30

## 2015-08-20 MED ORDER — HYDROCODONE-ACETAMINOPHEN 5-325 MG PO TABS
1.0000 | ORAL_TABLET | ORAL | Status: DC | PRN
  Administered 2015-08-20: 1 via ORAL

## 2015-08-20 MED ORDER — PROMETHAZINE HCL 25 MG/ML IJ SOLN: 12.5000 mg | INTRAMUSCULAR | Status: DC | PRN

## 2015-08-20 MED ORDER — FAMOTIDINE 20 MG PO TABS
20.0000 mg | ORAL_TABLET | Freq: Once | ORAL | Status: AC
  Administered 2015-08-20: 20 mg via ORAL

## 2015-08-20 MED ORDER — FAMOTIDINE 20 MG PO TABS
ORAL_TABLET | ORAL | Status: AC
  Administered 2015-08-20: 20 mg via ORAL
  Filled 2015-08-20: qty 1

## 2015-08-20 MED ORDER — HYDROCODONE-ACETAMINOPHEN 5-325 MG PO TABS
ORAL_TABLET | ORAL | Status: AC
  Filled 2015-08-20: qty 1

## 2015-08-20 MED ORDER — HYDROCODONE-ACETAMINOPHEN 5-325 MG PO TABS: 1.0000 | ORAL_TABLET | ORAL | Status: DC | PRN

## 2015-08-20 MED ORDER — ONDANSETRON HCL 4 MG/2ML IJ SOLN
INTRAMUSCULAR | Status: DC | PRN
  Administered 2015-08-20: 4 mg via INTRAVENOUS

## 2015-08-20 SURGICAL SUPPLY — 36 items
BENZOIN TINCTURE PRP APPL 2/3 (GAUZE/BANDAGES/DRESSINGS) ×3 IMPLANT
BLADE CLIPPER SURG (BLADE) ×3 IMPLANT
BLADE SURG 15 STRL SS SAFETY (BLADE) ×3 IMPLANT
CANISTER SUCT 1200ML W/VALVE (MISCELLANEOUS) ×3 IMPLANT
CHLORAPREP W/TINT 26ML (MISCELLANEOUS) ×3 IMPLANT
CLOSURE WOUND 1/2 X4 (GAUZE/BANDAGES/DRESSINGS) ×1
DRAPE LAPAROTOMY 100X77 ABD (DRAPES) ×3 IMPLANT
DRESSING TELFA 4X3 1S ST N-ADH (GAUZE/BANDAGES/DRESSINGS) ×3 IMPLANT
DRSG TEGADERM 4X4.75 (GAUZE/BANDAGES/DRESSINGS) ×3 IMPLANT
GLOVE BIO SURGEON STRL SZ7.5 (GLOVE) ×6 IMPLANT
GLOVE INDICATOR 8.0 STRL GRN (GLOVE) ×6 IMPLANT
GOWN STRL REUS W/ TWL LRG LVL3 (GOWN DISPOSABLE) ×2 IMPLANT
GOWN STRL REUS W/TWL LRG LVL3 (GOWN DISPOSABLE) ×4
LABEL OR SOLS (LABEL) ×3 IMPLANT
MESH VENTRALEX ST 2.5 CRC MED (Mesh General) ×3 IMPLANT
NDL SAFETY 22GX1.5 (NEEDLE) ×3 IMPLANT
NEEDLE HYPO 22GX1.5 SAFETY (NEEDLE) ×6 IMPLANT
NEEDLE HYPO 25X1 1.5 SAFETY (NEEDLE) IMPLANT
NS IRRIG 500ML POUR BTL (IV SOLUTION) ×3 IMPLANT
PACK BASIN MINOR ARMC (MISCELLANEOUS) ×3 IMPLANT
PAD GROUND ADULT SPLIT (MISCELLANEOUS) ×3 IMPLANT
SPONGE LAP 18X18 5 PK (GAUZE/BANDAGES/DRESSINGS) ×3 IMPLANT
STAPLER SKIN PROX 35W (STAPLE) IMPLANT
STRIP CLOSURE SKIN 1/2X4 (GAUZE/BANDAGES/DRESSINGS) ×2 IMPLANT
SUT MAXON ABS #0 GS21 30IN (SUTURE) ×6 IMPLANT
SUT SURGILON 0 BLK (SUTURE) ×3 IMPLANT
SUT VIC AB 2-0 BRD 54 (SUTURE) ×3 IMPLANT
SUT VIC AB 2-0 CT1 27 (SUTURE) ×2
SUT VIC AB 2-0 CT1 TAPERPNT 27 (SUTURE) ×1 IMPLANT
SUT VIC AB 3-0 SH 27 (SUTURE) ×2
SUT VIC AB 3-0 SH 27X BRD (SUTURE) ×1 IMPLANT
SUT VIC AB 4-0 FS2 27 (SUTURE) ×3 IMPLANT
SUT VICRYL+ 3-0 144IN (SUTURE) ×3 IMPLANT
SWABSTK COMLB BENZOIN TINCTURE (MISCELLANEOUS) ×3 IMPLANT
SYR 3ML LL SCALE MARK (SYRINGE) ×3 IMPLANT
SYR CONTROL 10ML (SYRINGE) ×3 IMPLANT

## 2015-08-20 NOTE — Anesthesia Postprocedure Evaluation (Signed)
  Anesthesia Post-op Note  Patient: Caleb Clements.  Procedure(s) Performed: Procedure(s): HERNIA REPAIR EPIGASTRIC ADULT (N/A)  Anesthesia type:General  Patient location: PACU  Post pain: Pain level controlled  Post assessment: Post-op Vital signs reviewed, Patient's Cardiovascular Status Stable, Respiratory Function Stable, Patent Airway and No signs of Nausea or vomiting  Post vital signs: Reviewed and stable  Last Vitals:  Filed Vitals:   08/20/15 1546  BP: 121/74  Pulse: 52  Temp:   Resp: 18    Level of consciousness: awake, alert  and patient cooperative  Complications: No apparent anesthesia complications

## 2015-08-20 NOTE — Transfer of Care (Signed)
Immediate Anesthesia Transfer of Care Note  Patient: Caleb Clements.  Procedure(s) Performed: Procedure(s): HERNIA REPAIR EPIGASTRIC ADULT (N/A)  Patient Location: PACU  Anesthesia Type:General  Level of Consciousness: awake, alert  and oriented  Airway & Oxygen Therapy: Patient Spontanous Breathing and Patient connected to nasal cannula oxygen  Post-op Assessment: Report given to RN, Post -op Vital signs reviewed and stable and Patient moving all extremities  Post vital signs: Reviewed and stable  Last Vitals:  Filed Vitals:   08/20/15 1406  BP: 151/89  Pulse: 73  Temp:   Resp: 22    Complications: No apparent anesthesia complications

## 2015-08-20 NOTE — Op Note (Signed)
Preoperative diagnosis: Epigastric hernia.  Postoperative diagnosis: Epigastric and umbilical hernia.  Operative procedure: Repair of epigastric and umbilical hernia with 6.4 cm ventral ex mesh.  Operative surgeon: Lane Hacker, M.D.  Anesthesia: Gen. by LMA, Marcaine 0.5% with 1-200,000 epinephrine, 30 mL local infiltration. Toradol: 30 mg at the surgical field.  Estimate blood loss: Less than 5 mL.  Clinical note: This 39 year old male is developed a symptomatic epigastric hernia. He was made for elective repair.  Operative note: The patient underwent general anesthesia by LMA without difficulty. Hair was removed from the surgical field with clippers. The abdomen was prepped with chlor prep and drape. A vertical incision centered on the location 3-4 cm above the umbilicus, where the hernia had been clinically palpated, was made. Skin was incised sharply and the remaining dissection completed with electrocautery. At the site of the suspected epigastric hernia there was indeed a had of preperitoneal fat slipping through the fascia. This was a transversely oriented 2 mm wide, 1 cm slit. The fat was ligated with 30 Vicryls tie. Attention was then turned inferiorly where there was a 2 cm fascial defect with a hernia sac. The hernia sac was excised electrocautery. The protruding omentum was returned to the abdominal cavity. Palpation from the undersurface of the umbilical defect showed no additional fascial weakness. A 6.4 cm ventral ex mesh was smooth against the posterior surface of the linea alba and posterior rectus sheath bilaterally. This was anchored with trans-fascial sutures with 4 point fixation. The superior suture was placed with the mesh covering the epigastric defect. The umbilical defect was then approximated with interrupted 0 Surgilon simple sutures. The adipose layer was closed with a running 2-0 Vicryls suture anchoring it to the fascia to minimize dead space. A second layer of 3-0  Vicryls running suture was placed. The skin was closed with a running 4-Vicryls suture in a subcuticular location.  Benzoin, Steri-Strips, Telfa and Tegaderm dressings were applied.  The patient did receive 2 g intravenously on his arrival in the recovery room due to the placement of prosthetic mesh.

## 2015-08-20 NOTE — H&P (Signed)
No change in history or clinical exam.  For epigastric hernia repair.

## 2015-08-20 NOTE — Discharge Instructions (Signed)

## 2015-08-20 NOTE — Anesthesia Preprocedure Evaluation (Signed)
Anesthesia Evaluation  Patient identified by MRN, date of birth, ID band Patient awake    Reviewed: Allergy & Precautions, NPO status , Patient's Chart, lab work & pertinent test results  History of Anesthesia Complications Negative for: history of anesthetic complications  Airway Mallampati: II  TM Distance: >3 FB Neck ROM: Full    Dental  (+) Chipped   Pulmonary Current Smoker (1 ppd),           Cardiovascular negative cardio ROS       Neuro/Psych    GI/Hepatic negative GI ROS, Neg liver ROS,   Endo/Other  negative endocrine ROS  Renal/GU Renal disease (stones)     Musculoskeletal   Abdominal   Peds  Hematology negative hematology ROS (+)   Anesthesia Other Findings   Reproductive/Obstetrics                             Anesthesia Physical Anesthesia Plan  ASA: II  Anesthesia Plan: General   Post-op Pain Management:    Induction: Intravenous  Airway Management Planned: Oral ETT  Additional Equipment:   Intra-op Plan:   Post-operative Plan:   Informed Consent: I have reviewed the patients History and Physical, chart, labs and discussed the procedure including the risks, benefits and alternatives for the proposed anesthesia with the patient or authorized representative who has indicated his/her understanding and acceptance.     Plan Discussed with:   Anesthesia Plan Comments:         Anesthesia Quick Evaluation

## 2015-08-20 NOTE — Anesthesia Procedure Notes (Signed)
Procedure Name: LMA Insertion Date/Time: 08/20/2015 1:20 PM Performed by: Peyton Najjar Pre-anesthesia Checklist: Patient identified, Patient being monitored, Timeout performed, Emergency Drugs available and Suction available Patient Re-evaluated:Patient Re-evaluated prior to inductionOxygen Delivery Method: Circle system utilized Preoxygenation: Pre-oxygenation with 100% oxygen Intubation Type: IV induction Ventilation: Mask ventilation without difficulty LMA: LMA inserted LMA Size: 4.5 Tube type: Oral Number of attempts: 1 Placement Confirmation: positive ETCO2 and breath sounds checked- equal and bilateral Tube secured with: Tape Dental Injury: Teeth and Oropharynx as per pre-operative assessment

## 2015-08-25 ENCOUNTER — Encounter: Payer: Self-pay | Admitting: General Surgery

## 2015-08-25 ENCOUNTER — Ambulatory Visit (INDEPENDENT_AMBULATORY_CARE_PROVIDER_SITE_OTHER): Payer: Commercial Indemnity | Admitting: General Surgery

## 2015-08-25 VITALS — BP 120/74 | HR 80 | Resp 12 | Ht 68.0 in | Wt 191.0 lb

## 2015-08-25 DIAGNOSIS — K429 Umbilical hernia without obstruction or gangrene: Secondary | ICD-10-CM | POA: Insufficient documentation

## 2015-08-25 DIAGNOSIS — K439 Ventral hernia without obstruction or gangrene: Secondary | ICD-10-CM

## 2015-08-25 NOTE — Patient Instructions (Signed)
Patient to return in one month. 

## 2015-08-25 NOTE — Progress Notes (Signed)
Patient ID: Caleb Clements., male   DOB: Jan 12, 1976, 39 y.o.   MRN: [id number]  Chief Complaint  Patient presents with  . Routine Post Op    Epigastric hernia    HPI Caleb Clements. is a 39 y.o. male here today for his post op Epigastric hernia repair done on 08/20/15. Patient states he is doing well.  HPI  Past Medical History  Diagnosis Date  . Diverticulitis July 2014    Suggestion of microperforation on CT.  Marland Kitchen Chronic kidney disease     KIDNEY STONE    Past Surgical History  Procedure Laterality Date  . No past surgeries    . Epigastric hernia repair N/A 08/20/2015    Procedure: HERNIA REPAIR EPIGASTRIC ADULT;  Surgeon: Earline Mayotte, MD;  Location: [facility] ORS;  Service: General;  Laterality: N/A;  . Hernia repair  08/20/2015    Epigastric and umbilical hernia, 6.4 cm Vemtrax mesh    Family History  Problem Relation Age of Onset  . Diabetes Father     Social History Social History  Substance Use Topics  . Smoking status: Current Every Day Smoker -- 1.00 packs/day for 17 years    Types: Cigarettes  . Smokeless tobacco: Current User  . Alcohol Use: No    Allergies  Allergen Reactions  . Dilaudid [Hydromorphone Hcl] Shortness Of Breath  . Propoxyphene Shortness Of Breath, Nausea And Vomiting and Rash    Patient has trouble recalling full reaction.    No current outpatient prescriptions on file.   No current facility-administered medications for this visit.    Review of Systems Review of Systems  Constitutional: Negative.   Respiratory: Negative.   Cardiovascular: Negative.     Blood pressure 120/74, pulse 80, resp. rate 12, height  (1.727 m), weight 191 lb (86.637 kg).  Physical Exam Physical Exam  Constitutional: He is oriented to person, place, and time. He appears well-developed and well-nourished.  Abdominal:    Little irration  when steri stips was removed epigastric area.   Neurological: He is alert and oriented to person,  place, and time.  Skin: Skin is warm and dry.     Assessment    Doing well status post repair of umbilical and epigastric hernias.    Plan    The patient was instructed in proper lifting technique. He may resume activities as he is comfortable. Good judgment was strenuous activity encouraged.    Patient may return to work on 08/26/15. Return to office one month.   PCP:  Cherrie Distance 08/25/2015, 8:21 PM

## 2015-09-22 ENCOUNTER — Ambulatory Visit: Payer: Commercial Indemnity | Admitting: General Surgery

## 2015-09-24 ENCOUNTER — Ambulatory Visit: Payer: Commercial Indemnity | Admitting: General Surgery

## 2015-10-15 ENCOUNTER — Ambulatory Visit (INDEPENDENT_AMBULATORY_CARE_PROVIDER_SITE_OTHER): Payer: Commercial Indemnity | Admitting: General Surgery

## 2015-10-15 ENCOUNTER — Encounter: Payer: Self-pay | Admitting: General Surgery

## 2015-10-15 VITALS — BP 140/82 | HR 79 | Resp 12 | Ht 68.0 in | Wt 196.0 lb

## 2015-10-15 DIAGNOSIS — K439 Ventral hernia without obstruction or gangrene: Secondary | ICD-10-CM

## 2015-10-15 DIAGNOSIS — K429 Umbilical hernia without obstruction or gangrene: Secondary | ICD-10-CM

## 2015-10-15 NOTE — Patient Instructions (Addendum)
Patient to return as needed. Proper lifting techniques reviewed. 

## 2015-10-15 NOTE — Progress Notes (Signed)
Patient ID: Caleb Sallesharles A Mruk Jr., male   DOB: 1976/02/25, 39 y.o.   MRN: [id number]  Chief Complaint  Patient presents with  . Routine Post Op    HPI Caleb SallesCharles A Dowland Jr. is a 39 y.o. male here today for his post op Epigastric hernia repair done on 08/20/15. Patient states he is doing well.  HPI  Past Medical History  Diagnosis Date  . Diverticulitis July 2014    Suggestion of microperforation on CT.  Marland Kitchen. Chronic kidney disease     KIDNEY STONE    Past Surgical History  Procedure Laterality Date  . No past surgeries    . Epigastric hernia repair N/A 08/20/2015    Procedure: HERNIA REPAIR EPIGASTRIC ADULT;  Surgeon: Earline MayotteJeffrey W Denea Cheaney, MD;  Location: [facility] ORS;  Service: General;  Laterality: N/A;  . Hernia repair  08/20/2015    Epigastric and umbilical hernia, 6.4 cm Vemtrax mesh    Family History  Problem Relation Age of Onset  . Diabetes Father     Social History Social History  Substance Use Topics  . Smoking status: Current Every Day Smoker -- 1.00 packs/day for 17 years    Types: Cigarettes  . Smokeless tobacco: Current User  . Alcohol Use: No    Allergies  Allergen Reactions  . Dilaudid [Hydromorphone Hcl] Shortness Of Breath  . Propoxyphene Shortness Of Breath, Nausea And Vomiting and Rash    Patient has trouble recalling full reaction.    No current outpatient prescriptions on file.   No current facility-administered medications for this visit.    Review of Systems Review of Systems  Constitutional: Negative.   Respiratory: Negative.   Cardiovascular: Negative.     Blood pressure 140/82, pulse 79, resp. rate 12, height 5\' 8"  (1.727 m), weight 196 lb (88.905 kg).  Physical Exam Physical Exam  Constitutional: He is oriented to person, place, and time. He appears well-developed and well-nourished.  Abdominal:  Incision looks clean and well healed.  Genitourinary:     Neurological: He is alert and oriented to person, place, and time.  Skin: Skin is  warm and dry.      Assessment    Doing well status post left scrotal hernia repair.    Plan    Proper lifting technique was again reviewed.    Patient to return as needed.  PCP:  No Pcp   Earline MayotteByrnett, Aryel Edelen W 10/17/2015, 12:25 PM

## 2016-03-06 IMAGING — CT CT RENAL STONE PROTOCOL
3 of 5 series · 6 of 46 positions shown, 11 images · non-contrast
Comparison: CT scan 06/03/2013

CLINICAL DATA: Right flank pain starting [DATE] today

EXAM:
CT ABDOMEN AND PELVIS WITHOUT CONTRAST
TECHNIQUE: Multidetector CT imaging of the abdomen and pelvis was performed
following the standard protocol without IV contrast.

[Series 4: lung 5.0 b60f · axial · 0.67mm/px · z∈[-71,-46]mm · 2 of 15 slices shown, 5 images]
[im 5/15  soft-tissue]
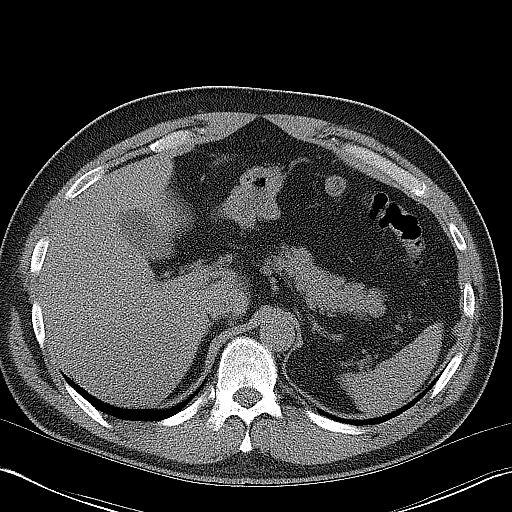
[im 5/15  lung]
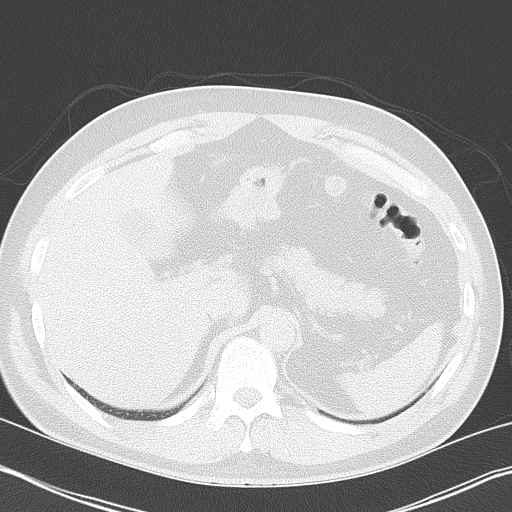
[im 5/15  bone]
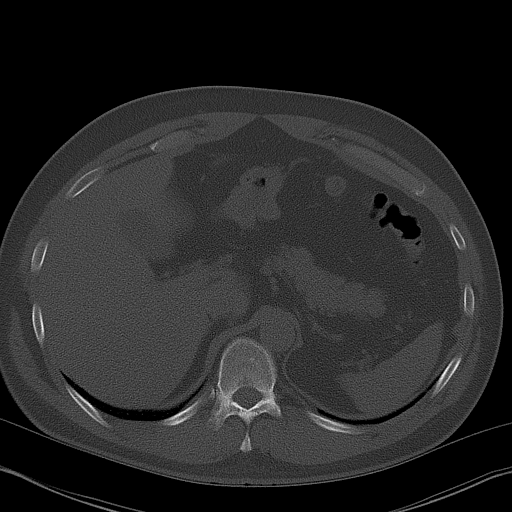
[im 10/15  soft-tissue]
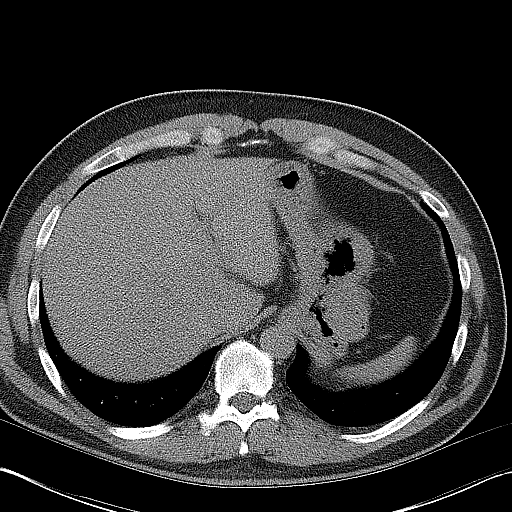
[im 10/15  lung]
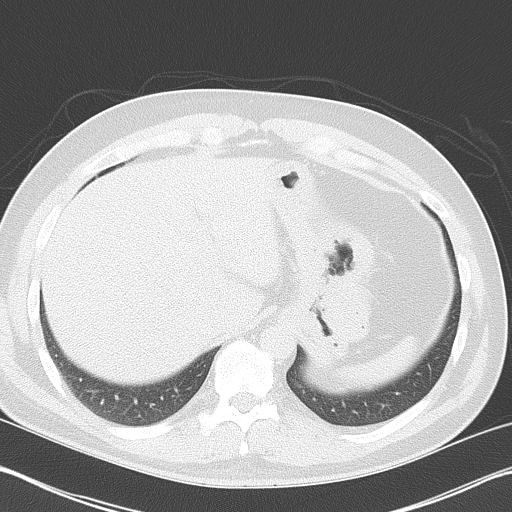

[Series 7: mpr coronal 3.0mm · coronal · 0.68mm/px · 3 of 88 slices shown, 4 images]
[im 30/88  soft-tissue]
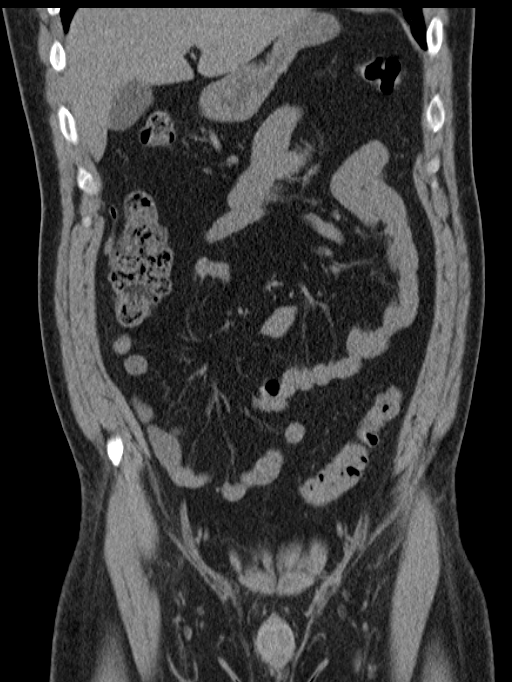
[im 39/88  soft-tissue]
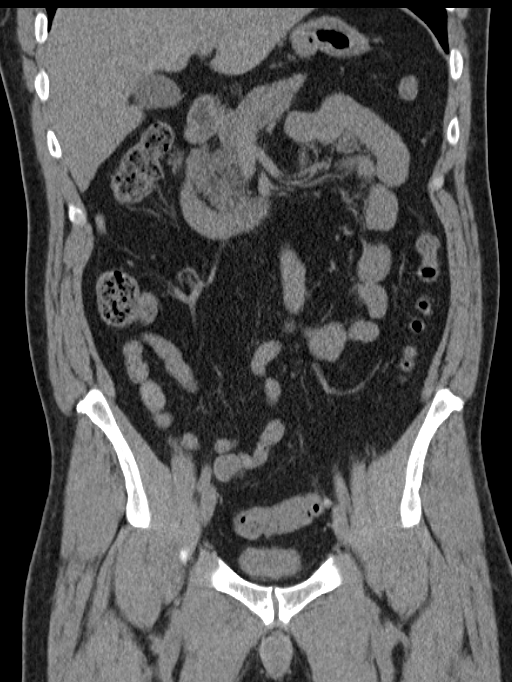
[im 39/88  bone]
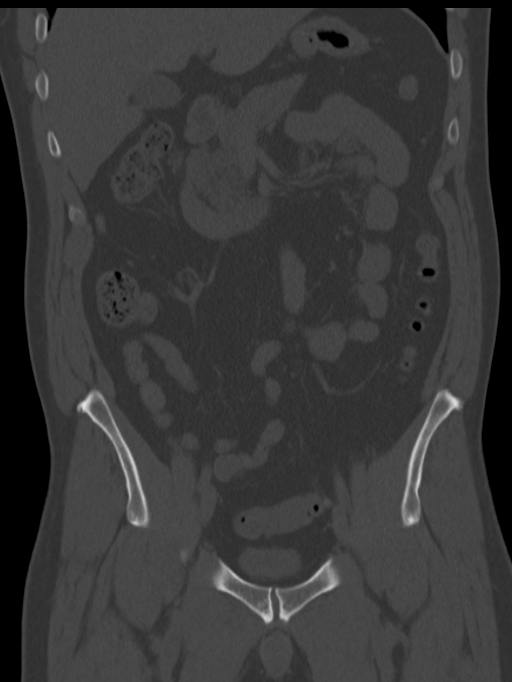
[im 49/88  soft-tissue]
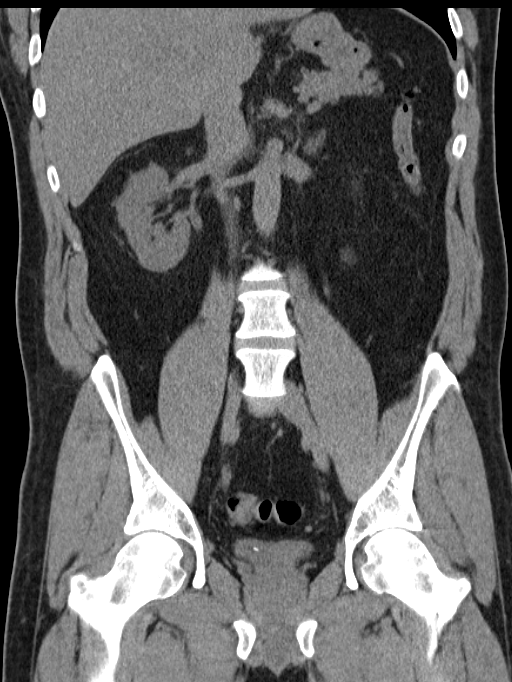

[Series 8: mpr sagittal 3.0mm · sagittal · 0.55mm/px · 1 of 108 slices shown, 2 images]
[im 36/108  soft-tissue]
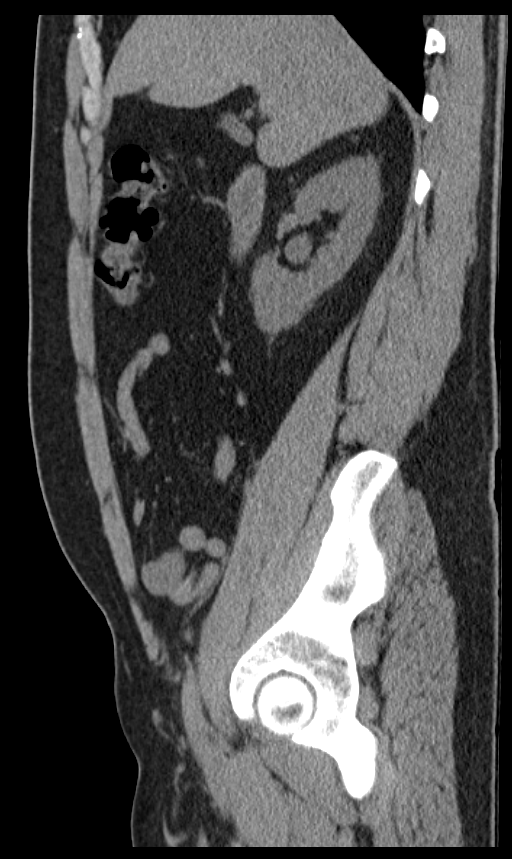
[im 36/108  bone]
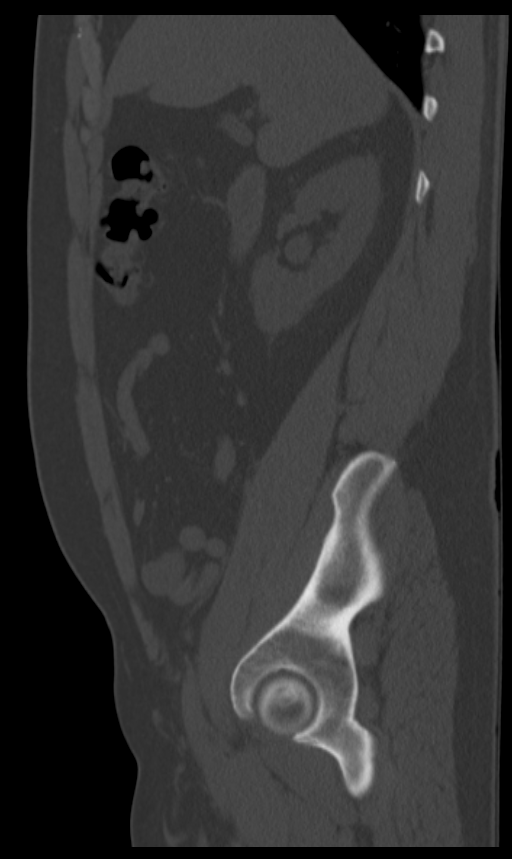

[6 of 46 positions shown; findings below may reference images not displayed]

FINDINGS: Sagittal images of the spine are unremarkable. There is a midline
supraumbilical hernia containing fat measures about 2.2 cm best seen
in sagittal image 59. A second midline ventral hernia containing fat
just above the umbilical region measures 2.8 cm. There is no
evidence of acute complication. Tiny umbilical hernia containing fat
without evidence of acute complication.

Unenhanced liver shows no biliary ductal dilatation. No calcified
gallstones are noted within gallbladder. Unenhanced pancreas spleen
and adrenal glands are unremarkable.

There is mild right hydronephrosis and right hydroureter. No
nephrolithiasis. No proximal or mid calcified ureteral calculi are
noted bilaterally.

In axial image 75 there is a 4 mm calcified calculus in right
posterior urinary bladder wall probable just beyond the right UVJ.
Limited assessment of the urinary bladder which is empty.

No aortic aneurysm. No small bowel obstruction. No ascites or free
air. No adenopathy.

There is no pericecal inflammation. Normal retrocecal appendix
clearly visualized axial image 35. Scattered diverticula are noted
descending colon. No evidence of acute diverticulitis.
IMPRESSION: 1. There is mild right hydronephrosis and right hydroureter.
2. There is 4 mm calcified calculus in right posterior wall of
urinary bladder probable just beyond or at the right UVJ. Limited
assessment of the urinary bladder which is empty.
3. Normal retrocecal appendix.  No pericecal inflammation.
4. Small umbilical hernia containing omental fat. Two additional
midline supraumbilical hernias containing fat without evidence of
acute complication.
5. No small bowel obstruction.

## 2023-03-17 ENCOUNTER — Ambulatory Visit: Payer: Self-pay | Admitting: Internal Medicine

## 2023-04-01 ENCOUNTER — Ambulatory Visit: Payer: Managed Care, Other (non HMO) | Admitting: Internal Medicine

## 2023-04-11 ENCOUNTER — Ambulatory Visit: Payer: Managed Care, Other (non HMO) | Admitting: Internal Medicine

## 2023-04-13 ENCOUNTER — Ambulatory Visit (INDEPENDENT_AMBULATORY_CARE_PROVIDER_SITE_OTHER): Payer: No Typology Code available for payment source | Admitting: Internal Medicine

## 2023-04-13 ENCOUNTER — Encounter: Payer: Self-pay | Admitting: Internal Medicine

## 2023-04-13 VITALS — BP 111/70 | HR 75 | Ht 69.0 in | Wt 163.2 lb

## 2023-04-13 DIAGNOSIS — Z2821 Immunization not carried out because of patient refusal: Secondary | ICD-10-CM

## 2023-04-13 DIAGNOSIS — Z13220 Encounter for screening for lipoid disorders: Secondary | ICD-10-CM | POA: Diagnosis not present

## 2023-04-13 DIAGNOSIS — F17200 Nicotine dependence, unspecified, uncomplicated: Secondary | ICD-10-CM

## 2023-04-13 DIAGNOSIS — Z1329 Encounter for screening for other suspected endocrine disorder: Secondary | ICD-10-CM | POA: Diagnosis not present

## 2023-04-13 DIAGNOSIS — K921 Melena: Secondary | ICD-10-CM | POA: Insufficient documentation

## 2023-04-13 DIAGNOSIS — Z1211 Encounter for screening for malignant neoplasm of colon: Secondary | ICD-10-CM

## 2023-04-13 DIAGNOSIS — Z1321 Encounter for screening for nutritional disorder: Secondary | ICD-10-CM

## 2023-04-13 DIAGNOSIS — Z131 Encounter for screening for diabetes mellitus: Secondary | ICD-10-CM | POA: Diagnosis not present

## 2023-04-13 DIAGNOSIS — Z114 Encounter for screening for human immunodeficiency virus [HIV]: Secondary | ICD-10-CM

## 2023-04-13 DIAGNOSIS — Z1159 Encounter for screening for other viral diseases: Secondary | ICD-10-CM

## 2023-04-13 NOTE — Assessment & Plan Note (Signed)
Currently smokes 1 pack/day of cigarettes and has been smoking since age 47.  He states that he is not ready to quit but is aware that it would be in his best interest. -The patient was counseled on the dangers of tobacco use, and was advised to quit.  Reviewed strategies to maximize success, including removing cigarettes and smoking materials from environment, stress management, substitution of other forms of reinforcement, support of family/friends, and written materials.

## 2023-04-13 NOTE — Patient Instructions (Signed)
It was a pleasure to see you today.  Thank you for giving [place] the opportunity to be involved in your care.  Below is a brief recap of your visit and next steps.  We will plan to see you again in 6 months.  Summary You have established care today. We will check labs You have been referred to gastroenterology We will tentatively plan for follow up in 6 months Please consider quitting smoking and let me know how we can help

## 2023-04-13 NOTE — Assessment & Plan Note (Signed)
Due for Tdap.  This was declined for now but he would like to receive it at his next appointment.

## 2023-04-13 NOTE — Assessment & Plan Note (Signed)
His acute concern today is recent hematochezia.  He has noted bright red blood both when wiping and in the toilet.  He is unaware of any family history of colon cancer.  Further denies associated symptoms. -Gastroenterology referral placed today -Checking baseline labs, including CBC and iron studies

## 2023-04-13 NOTE — Progress Notes (Signed)
New Patient Office Visit  Subjective    Patient ID: Caleb Salles., male    DOB: Jan 13, 1976  Age: 47 y.o. MRN: [id number]  CC:  Chief Complaint  Patient presents with   Establish Care    HPI Caleb Clements. presents to establish care.  He is a 47 year old male with a past medical history significant for diverticulitis, nephrolithiasis, and epigastric hernia repair in 2016.  He is not taking any medications currently and has not had a PCP previously.  Caleb Clements is currently self-employed.  He endorses current tobacco use, smoking 1 pack/day of cigarettes since age 43.  He additionally endorses daily marijuana use but denies alcohol use.  His family medical history is significant for breast cancer in his mother and diabetes mellitus.  Caleb Clements acute concern today is recent hematochezia.  He has noted bright red blood when wiping after a bowel movement and has also noted bright red blood in the toilet.  He is unaware of any family history of colon cancer.  Acute concerns, chronic medical conditions, and outstanding preventative care items discussed today are individually addressed A/P below.  No outpatient encounter medications on file as of 04/13/2023.   No facility-administered encounter medications on file as of 04/13/2023.    Past Medical History:  Diagnosis Date   Chronic kidney disease    KIDNEY STONE   Diverticulitis 05/29/2013   Suggestion of microperforation on CT.   Epigastric hernia 08/11/2015    Past Surgical History:  Procedure Laterality Date   EPIGASTRIC HERNIA REPAIR N/A 08/20/2015   Procedure: HERNIA REPAIR EPIGASTRIC ADULT;  Surgeon: Earline Mayotte, MD;  Location: [facility] ORS;  Service: General;  Laterality: N/A;   HERNIA REPAIR  08/20/2015   Epigastric and umbilical hernia, 6.4 cm Vemtrax mesh   NO PAST SURGERIES      Family History  Problem Relation Age of Onset   Diabetes Father     Social History   Socioeconomic History   Marital status:  Married    Spouse name: Not on file   Number of children: Not on file   Years of education: Not on file   Highest education level: Not on file  Occupational History   Not on file  Tobacco Use   Smoking status: Every Day    Packs/day: 1.00    Years: 17.00    Additional pack years: 0.00    Total pack years: 17.00    Types: Cigarettes   Smokeless tobacco: Current  Substance and Sexual Activity   Alcohol use: No   Drug use: No   Sexual activity: Yes    Birth control/protection: None  Other Topics Concern   Not on file  Social History Narrative   Not on file   Social Determinants of Health   Financial Resource Strain: Not on file  Food Insecurity: Not on file  Transportation Needs: Not on file  Physical Activity: Not on file  Stress: Not on file  Social Connections: Not on file  Intimate Partner Violence: Not on file   Review of Systems  Constitutional:  Negative for chills and fever.  HENT:  Negative for sore throat.   Respiratory:  Negative for cough and shortness of breath.   Cardiovascular:  Negative for chest pain, palpitations and leg swelling.  Gastrointestinal:  Positive for blood in stool. Negative for abdominal pain, constipation, diarrhea, nausea and vomiting.  Genitourinary:  Negative for dysuria and hematuria.  Musculoskeletal:  Negative for myalgias.  Skin:  Negative for itching and rash.  Neurological:  Negative for dizziness and headaches.  Psychiatric/Behavioral:  Negative for depression and suicidal ideas.    Objective    BP 111/70   Pulse 75   Ht 5\' 9"  (1.753 m)   Wt 163 lb 3.2 oz (74 kg)   SpO2 96%   BMI 24.10 kg/m   Physical Exam Vitals reviewed.  Constitutional:      General: He is not in acute distress.    Appearance: Normal appearance. He is not ill-appearing.  HENT:     Head: Normocephalic and atraumatic.     Right Ear: External ear normal.     Left Ear: External ear normal.     Nose: Nose normal. No congestion or rhinorrhea.      Mouth/Throat:     Mouth: Mucous membranes are moist.     Pharynx: Oropharynx is clear.  Eyes:     General: No scleral icterus.    Extraocular Movements: Extraocular movements intact.     Conjunctiva/sclera: Conjunctivae normal.     Pupils: Pupils are equal, round, and reactive to light.  Cardiovascular:     Rate and Rhythm: Normal rate and regular rhythm.     Pulses: Normal pulses.     Heart sounds: Normal heart sounds. No murmur heard. Pulmonary:     Effort: Pulmonary effort is normal.     Breath sounds: Normal breath sounds. No wheezing, rhonchi or rales.  Abdominal:     General: Abdomen is flat. Bowel sounds are normal. There is no distension.     Palpations: Abdomen is soft.     Tenderness: There is no abdominal tenderness.  Musculoskeletal:        General: No swelling or deformity. Normal range of motion.     Cervical back: Normal range of motion.  Skin:    General: Skin is warm and dry.     Capillary Refill: Capillary refill takes less than 2 seconds.  Neurological:     General: No focal deficit present.     Mental Status: He is alert and oriented to person, place, and time.     Motor: No weakness.  Psychiatric:        Mood and Affect: Mood normal.        Behavior: Behavior normal.        Thought Content: Thought content normal.    Assessment & Plan:   Problem List Items Addressed This Visit       Tobacco use disorder    Currently smokes 1 pack/day of cigarettes and has been smoking since age 6.  He states that he is not ready to quit but is aware that it would be in his best interest. -The patient was counseled on the dangers of tobacco use, and was advised to quit.  Reviewed strategies to maximize success, including removing cigarettes and smoking materials from environment, stress management, substitution of other forms of reinforcement, support of family/friends, and written materials.       Hematochezia    His acute concern today is recent hematochezia.  He  has noted bright red blood both when wiping and in the toilet.  He is unaware of any family history of colon cancer.  Further denies associated symptoms. -Gastroenterology referral placed today -Checking baseline labs, including CBC and iron studies      Tetanus, diphtheria, and acellular pertussis (Tdap) vaccination declined    Due for Tdap.  This was declined for now but he would like to receive it  at his next appointment.      Return in about 6 months (around 10/14/2023).   Billie Lade, MD

## 2023-04-14 LAB — IRON,TIBC AND FERRITIN PANEL
Ferritin: 119 ng/mL (ref 30–400)
Iron Saturation: 22 % (ref 15–55)
Iron: 74 ug/dL (ref 38–169)
Total Iron Binding Capacity: 337 ug/dL (ref 250–450)
UIBC: 263 ug/dL (ref 111–343)

## 2023-04-14 LAB — CMP14+EGFR
ALT: 19 IU/L (ref 0–44)
AST: 17 IU/L (ref 0–40)
Albumin/Globulin Ratio: 1.7 (ref 1.2–2.2)
Albumin: 4.5 g/dL (ref 4.1–5.1)
Alkaline Phosphatase: 94 IU/L (ref 44–121)
BUN/Creatinine Ratio: 8 — ABNORMAL LOW (ref 9–20)
BUN: 8 mg/dL (ref 6–24)
Bilirubin Total: 0.4 mg/dL (ref 0.0–1.2)
CO2: 24 mmol/L (ref 20–29)
Calcium: 9.8 mg/dL (ref 8.7–10.2)
Chloride: 99 mmol/L (ref 96–106)
Creatinine, Ser: 1.05 mg/dL (ref 0.76–1.27)
Globulin, Total: 2.6 g/dL (ref 1.5–4.5)
Glucose: 83 mg/dL (ref 70–99)
Potassium: 3.9 mmol/L (ref 3.5–5.2)
Sodium: 138 mmol/L (ref 134–144)
Total Protein: 7.1 g/dL (ref 6.0–8.5)
eGFR: 89 mL/min/{1.73_m2} (ref >59)

## 2023-04-14 LAB — LIPID PANEL
Chol/HDL Ratio: 4.5 ratio (ref 0.0–5.0)
Cholesterol, Total: 211 mg/dL — ABNORMAL HIGH (ref 100–199)
HDL: 47 mg/dL (ref >39)
LDL Chol Calc (NIH): 137 mg/dL — ABNORMAL HIGH (ref 0–99)
Triglycerides: 152 mg/dL — ABNORMAL HIGH (ref 0–149)
VLDL Cholesterol Cal: 27 mg/dL (ref 5–40)

## 2023-04-14 LAB — CBC WITH DIFFERENTIAL/PLATELET
Basophils Absolute: 0.1 10*3/uL (ref 0.0–0.2)
Basos: 1 %
EOS (ABSOLUTE): 0.2 10*3/uL (ref 0.0–0.4)
Eos: 2 %
Hematocrit: 49.4 % (ref 37.5–51.0)
Hemoglobin: 17 g/dL (ref 13.0–17.7)
Immature Grans (Abs): 0 10*3/uL (ref 0.0–0.1)
Immature Granulocytes: 0 %
Lymphocytes Absolute: 3.9 10*3/uL — ABNORMAL HIGH (ref 0.7–3.1)
Lymphs: 39 %
MCH: 32.6 pg (ref 26.6–33.0)
MCHC: 34.4 g/dL (ref 31.5–35.7)
MCV: 95 fL (ref 79–97)
Monocytes Absolute: 0.8 10*3/uL (ref 0.1–0.9)
Monocytes: 8 %
Neutrophils Absolute: 5.1 10*3/uL (ref 1.4–7.0)
Neutrophils: 50 %
Platelets: 348 10*3/uL (ref 150–450)
RBC: 5.21 x10E6/uL (ref 4.14–5.80)
RDW: 12.4 % (ref 11.6–15.4)
WBC: 10.1 10*3/uL (ref 3.4–10.8)

## 2023-04-14 LAB — TSH+FREE T4
Free T4: 1.03 ng/dL (ref 0.82–1.77)
TSH: 3.44 u[IU]/mL (ref 0.450–4.500)

## 2023-04-14 LAB — HCV AB W REFLEX TO QUANT PCR: HCV Ab: NONREACTIVE

## 2023-04-14 LAB — B12 AND FOLATE PANEL
Folate: 3.9 ng/mL (ref >3.0)
Vitamin B-12: 370 pg/mL (ref 232–1245)

## 2023-04-14 LAB — VITAMIN D 25 HYDROXY (VIT D DEFICIENCY, FRACTURES): Vit D, 25-Hydroxy: 10.1 ng/mL — ABNORMAL LOW (ref 30.0–100.0)

## 2023-04-14 LAB — HEMOGLOBIN A1C
Est. average glucose Bld gHb Est-mCnc: 108 mg/dL
Hgb A1c MFr Bld: 5.4 % (ref 4.8–5.6)

## 2023-04-14 LAB — HCV INTERPRETATION

## 2023-04-14 LAB — HIV ANTIBODY (ROUTINE TESTING W REFLEX): HIV Screen 4th Generation wRfx: NONREACTIVE

## 2023-04-18 ENCOUNTER — Other Ambulatory Visit: Payer: Self-pay | Admitting: Internal Medicine

## 2023-04-18 DIAGNOSIS — E559 Vitamin D deficiency, unspecified: Secondary | ICD-10-CM

## 2023-04-18 MED ORDER — VITAMIN D (ERGOCALCIFEROL) 1.25 MG (50000 UNIT) PO CAPS
50000.0000 [IU] | ORAL_CAPSULE | ORAL | 0 refills | Status: AC
Start: 2023-04-18 — End: 2023-07-05

## 2023-05-23 ENCOUNTER — Encounter: Payer: Self-pay | Admitting: Internal Medicine

## 2023-05-31 ENCOUNTER — Ambulatory Visit: Payer: No Typology Code available for payment source | Admitting: Internal Medicine

## 2023-06-15 ENCOUNTER — Ambulatory Visit: Payer: No Typology Code available for payment source | Admitting: Internal Medicine

## 2023-07-22 ENCOUNTER — Ambulatory Visit (INDEPENDENT_AMBULATORY_CARE_PROVIDER_SITE_OTHER): Payer: No Typology Code available for payment source | Admitting: Internal Medicine

## 2023-07-22 ENCOUNTER — Encounter: Payer: Self-pay | Admitting: Internal Medicine

## 2023-07-22 VITALS — BP 118/80 | HR 63 | Ht 69.0 in | Wt 152.8 lb

## 2023-07-22 DIAGNOSIS — Z1211 Encounter for screening for malignant neoplasm of colon: Secondary | ICD-10-CM

## 2023-07-22 DIAGNOSIS — K921 Melena: Secondary | ICD-10-CM

## 2023-07-22 DIAGNOSIS — F419 Anxiety disorder, unspecified: Secondary | ICD-10-CM | POA: Diagnosis not present

## 2023-07-22 DIAGNOSIS — R112 Nausea with vomiting, unspecified: Secondary | ICD-10-CM | POA: Diagnosis not present

## 2023-07-22 DIAGNOSIS — F411 Generalized anxiety disorder: Secondary | ICD-10-CM

## 2023-07-22 MED ORDER — FLUOXETINE HCL 20 MG PO TABS
20.0000 mg | ORAL_TABLET | Freq: Every day | ORAL | 3 refills | Status: AC
Start: 2023-07-22 — End: ?

## 2023-07-22 MED ORDER — PROMETHAZINE HCL 25 MG PO TABS
25.0000 mg | ORAL_TABLET | Freq: Three times a day (TID) | ORAL | 0 refills | Status: AC | PRN
Start: 2023-07-22 — End: ?

## 2023-07-22 NOTE — Patient Instructions (Signed)
It was a pleasure to see you today.  Thank you for giving [place] the opportunity to be involved in your care.  Below is a brief recap of your visit and next steps.  We will plan to see you again on 10/8  Summary Start fluoxetine 20 mg daily for anxiety Phenergan prescribed for nausea relief Follow up on 10/8 as currently scheduled

## 2023-07-22 NOTE — Progress Notes (Unsigned)
Acute Office Visit  Subjective:     Patient ID: Caleb Clements., male    DOB: [birth year], 47 y.o.   MRN: [id number]  Chief Complaint  Patient presents with   Emesis    Vomiting episodes.    Stress    Patient under a lot of stress.    [name] presents today for an acute visit with multiple concerns to discuss.  He was last evaluated by me on 5/15 as a new patient to establish care.  His acute concern at that time was hematochezia.  He was referred to gastroenterology and baseline labs were ordered, however he did not establish care.  Today [name] reports that hematochezia temporarily resolved, but more recently has returned.  He additionally endorses significant anxiety and vomiting episodes.  Vomiting seems to be associated with worsened anxiety.  [name] has unintentionally lost 11 pounds since his last appointment.  He further denies B symptoms including night sweats, fever/chills, and increased fatigue.  Review of Systems  Gastrointestinal:  Positive for blood in stool, nausea and vomiting.  Psychiatric/Behavioral:  The patient is nervous/anxious.       Objective:    BP 118/80 (BP Location: Left Arm, Patient Position: Sitting, Cuff Size: Small)   Pulse 63   Ht 5\' 9"  (1.753 m)   Wt 152 lb 12.8 oz (69.3 kg)   SpO2 98%   BMI 22.56 kg/m   Physical Exam Vitals reviewed.  Constitutional:      General: He is not in acute distress.    Appearance: Normal appearance. He is not ill-appearing.  HENT:     Head: Normocephalic and atraumatic.     Right Ear: External ear normal.     Left Ear: External ear normal.     Nose: Nose normal. No congestion or rhinorrhea.     Mouth/Throat:     Mouth: Mucous membranes are moist.     Pharynx: Oropharynx is clear.  Eyes:     General: No scleral icterus.    Extraocular Movements: Extraocular movements intact.     Conjunctiva/sclera: Conjunctivae normal.     Pupils: Pupils are equal, round, and reactive to light.  Cardiovascular:      Rate and Rhythm: Normal rate and regular rhythm.     Pulses: Normal pulses.     Heart sounds: Normal heart sounds. No murmur heard. Pulmonary:     Effort: Pulmonary effort is normal.     Breath sounds: Normal breath sounds. No wheezing, rhonchi or rales.  Abdominal:     General: Abdomen is flat. Bowel sounds are normal. There is no distension.     Palpations: Abdomen is soft.     Tenderness: There is no abdominal tenderness.  Musculoskeletal:        General: No swelling or deformity. Normal range of motion.     Cervical back: Normal range of motion.  Skin:    General: Skin is warm and dry.     Capillary Refill: Capillary refill takes less than 2 seconds.  Neurological:     General: No focal deficit present.     Mental Status: He is alert and oriented to person, place, and time.     Motor: No weakness.  Psychiatric:        Thought Content: Thought content normal.     Comments: Anxious mood/affect today       Assessment & Plan:   Problem List Items Addressed This Visit       Nausea with  vomiting    Seems to be triggered by increased stress and anxiety.  Phenergan has been prescribed for as needed nausea relief      Hematochezia    Patient returns to care today for an acute visit endorsing recurrence of hematochezia.  He was previously referred to gastroenterology but did not establish care.  Labs at that time were stable, not consistent with iron deficiency or anemia.  He again describes noticing blood in both on the toilet paper while wiping and in the toilet as well. -Gastroenterology referral placed again today      Generalized anxiety disorder    Chronic issue.  He describes struggling with anxiety for much of his life.  Most recently, he endorses significant stress and anxiety that leads to nausea with vomiting. -Through shared decision making, fluoxetine 20 mg daily has been started for treatment of anxiety -He will return to care in October for follow-up       Meds ordered this encounter  Medications   FLUoxetine (PROZAC) 20 MG tablet    Sig: Take 1 tablet (20 mg total) by mouth daily.    Dispense:  90 tablet    Refill:  3   promethazine (PHENERGAN) 25 MG tablet    Sig: Take 1 tablet (25 mg total) by mouth every 8 (eight) hours as needed for nausea or vomiting.    Dispense:  20 tablet    Refill:  0    Return if symptoms worsen or fail to improve.  Billie Lade, MD

## 2023-07-27 ENCOUNTER — Encounter: Payer: Self-pay | Admitting: Internal Medicine

## 2023-07-27 DIAGNOSIS — F411 Generalized anxiety disorder: Secondary | ICD-10-CM | POA: Insufficient documentation

## 2023-07-27 NOTE — Assessment & Plan Note (Addendum)
Seems to be triggered by increased stress and anxiety.  Phenergan has been prescribed for as needed nausea relief

## 2023-07-27 NOTE — Assessment & Plan Note (Signed)
Chronic issue.  He describes struggling with anxiety for much of his life.  Most recently, he endorses significant stress and anxiety that leads to nausea with vomiting. -Through shared decision making, fluoxetine 20 mg daily has been started for treatment of anxiety -He will return to care in October for follow-up

## 2023-07-27 NOTE — Assessment & Plan Note (Signed)
Patient returns to care today for an acute visit endorsing recurrence of hematochezia.  He was previously referred to gastroenterology but did not establish care.  Labs at that time were stable, not consistent with iron deficiency or anemia.  He again describes noticing blood in both on the toilet paper while wiping and in the toilet as well. -Gastroenterology referral placed again today

## 2023-08-04 ENCOUNTER — Encounter: Payer: Self-pay | Admitting: Internal Medicine

## 2023-08-04 ENCOUNTER — Ambulatory Visit: Payer: No Typology Code available for payment source | Admitting: Internal Medicine

## 2023-08-04 VITALS — BP 120/79 | HR 65 | Temp 98.6°F | Ht 67.0 in | Wt 155.8 lb

## 2023-08-04 DIAGNOSIS — K625 Hemorrhage of anus and rectum: Secondary | ICD-10-CM | POA: Diagnosis not present

## 2023-08-04 DIAGNOSIS — Z8719 Personal history of other diseases of the digestive system: Secondary | ICD-10-CM

## 2023-08-04 DIAGNOSIS — Z1211 Encounter for screening for malignant neoplasm of colon: Secondary | ICD-10-CM | POA: Diagnosis not present

## 2023-08-04 NOTE — Progress Notes (Signed)
Primary Care Physician:  Billie Lade, MD Primary Gastroenterologist:  Dr. Marletta Lor  Chief Complaint  Patient presents with   New Patient (Initial Visit)    Pt referred for colon cancer screening    HPI:   Caleb Clements. is a 47 y.o. male who presents to the clinic today by referral from his PCP Dr. Durwin Nora for evaluation.  Patient due for screening colonoscopy.  No previous colonoscopy.  Notes family history of colon cancer in 1 grandfather.  Has had multiple episodes of diverticulitis confirmed by CT imaging:  CT abdomen pelvis with contrast 06/03/2013 sigmoid diverticulitis, tiny focal area of microperforation.  CT abdomen pelvis with contrast 04/08/2022 acute uncomplicated diverticulitis of the distal descending/proximal sigmoid colon.  Treated both times conservatively without further complications.  Patient does note some intermittent rectal bleeding, mild, primarily bright red blood on toilet paper.  Happened approximately 4 months ago and again recently over the last month.  No rectal pain or discomfort.  Denies any upper GI symptoms including heartburn, reflux, dysphagia/odynophagia, epigastric or chest pain.  Past Medical History:  Diagnosis Date   Chronic kidney disease    KIDNEY STONE   Diverticulitis 05/29/2013   Suggestion of microperforation on CT.   Epigastric hernia 08/11/2015    Past Surgical History:  Procedure Laterality Date   EPIGASTRIC HERNIA REPAIR N/A 08/20/2015   Procedure: HERNIA REPAIR EPIGASTRIC ADULT;  Surgeon: Earline Mayotte, MD;  Location: [facility] ORS;  Service: General;  Laterality: N/A;   HERNIA REPAIR  08/20/2015   Epigastric and umbilical hernia, 6.4 cm Vemtrax mesh   NO PAST SURGERIES      Current Outpatient Medications  Medication Sig Dispense Refill   FLUoxetine (PROZAC) 20 MG tablet Take 1 tablet (20 mg total) by mouth daily. (Patient not taking: Reported on 08/04/2023) 90 tablet 3   promethazine (PHENERGAN) 25 MG tablet Take  1 tablet (25 mg total) by mouth every 8 (eight) hours as needed for nausea or vomiting. (Patient not taking: Reported on 08/04/2023) 20 tablet 0   No current facility-administered medications for this visit.    Allergies as of 08/04/2023 - Review Complete 08/04/2023  Allergen Reaction Noted   Dilaudid [hydromorphone hcl] Shortness Of Breath 12/24/2014   Propoxyphene Shortness Of Breath, Nausea And Vomiting, and Rash 12/24/2014    Family History  Problem Relation Age of Onset   Diabetes Father     Social History   Socioeconomic History   Marital status: Married    Spouse name: Not on file   Number of children: Not on file   Years of education: Not on file   Highest education level: Not on file  Occupational History   Not on file  Tobacco Use   Smoking status: Every Day    Current packs/day: 1.00    Average packs/day: 1 pack/day for 17.0 years (17.0 ttl pk-yrs)    Types: Cigarettes   Smokeless tobacco: Current  Substance and Sexual Activity   Alcohol use: No   Drug use: No   Sexual activity: Yes    Birth control/protection: None  Other Topics Concern   Not on file  Social History Narrative   Not on file   Social Determinants of Health   Financial Resource Strain: Not on file  Food Insecurity: Not on file  Transportation Needs: Not on file  Physical Activity: Not on file  Stress: Not on file  Social Connections: Not on file  Intimate Partner Violence: Not on file  Subjective: Review of Systems  Constitutional:  Negative for chills and fever.  HENT:  Negative for congestion and hearing loss.   Eyes:  Negative for blurred vision and double vision.  Respiratory:  Negative for cough and shortness of breath.   Cardiovascular:  Negative for chest pain and palpitations.  Gastrointestinal:  Negative for abdominal pain, blood in stool, constipation, diarrhea, heartburn, melena and vomiting.  Genitourinary:  Negative for dysuria and urgency.  Musculoskeletal:   Negative for joint pain and myalgias.  Skin:  Negative for itching and rash.  Neurological:  Negative for dizziness and headaches.  Psychiatric/Behavioral:  Negative for depression. The patient is not nervous/anxious.        Objective: BP 120/79   Pulse 65   Temp 98.6 F (37 C)   Ht 5\' 7"  (1.702 m)   Wt 155 lb 12.8 oz (70.7 kg)   BMI 24.40 kg/m  Physical Exam Constitutional:      Appearance: Normal appearance.  HENT:     Head: Normocephalic and atraumatic.  Eyes:     Extraocular Movements: Extraocular movements intact.     Conjunctiva/sclera: Conjunctivae normal.  Cardiovascular:     Rate and Rhythm: Normal rate and regular rhythm.  Pulmonary:     Effort: Pulmonary effort is normal.     Breath sounds: Normal breath sounds.  Abdominal:     General: Bowel sounds are normal.     Palpations: Abdomen is soft.  Musculoskeletal:        General: Normal range of motion.     Cervical back: Normal range of motion and neck supple.  Skin:    General: Skin is warm.  Neurological:     General: No focal deficit present.     Mental Status: He is alert and oriented to person, place, and time.  Psychiatric:        Mood and Affect: Mood normal.        Behavior: Behavior normal.      Assessment: *Colon cancer screening *Rectal bleeding, mild intermittent *History of diverticulitis  *Family history of colon cancer in grandfather  Plan: Will schedule for screening colonoscopy.The risks including infection, bleed, or perforation as well as benefits, limitations, alternatives and imponderables have been reviewed with the patient. Questions have been answered. All parties agreeable.  If no other identifiable cause for bleeding found besides internal hemorrhoids, can consider hemorrhoid banding in office.   Recommend patient increase his fiber in his diet to prevent further episodes of diverticulitis or adding over-the-counter fiber supplement.  Needs to ensure that he is drinking  adequate water throughout the day.  Thank you Dr. Durwin Nora for the kind referral  08/04/2023 1:52 PM   Disclaimer: This note was dictated with voice recognition software. Similar sounding words can inadvertently be transcribed and may not be corrected upon review.

## 2023-08-04 NOTE — Patient Instructions (Signed)
We will schedule you for colonoscopy for colon cancer screening purposes as well as your history of rectal bleeding and diverticulitis.  To prevent diverticulitis, recommend increasing fiber in your diet or adding over-the-counter Metamucil or Benefiber.  Ensure that you are drinking at least 6 glasses of water daily.  It was very nice meeting you today.  Dr. Marletta Lor

## 2023-08-16 ENCOUNTER — Telehealth: Payer: Self-pay | Admitting: *Deleted

## 2023-08-16 MED ORDER — CLENPIQ 10-3.5-12 MG-GM -GM/175ML PO SOLN: 1.0000 | ORAL | 0 refills | Status: DC

## 2023-08-16 NOTE — Telephone Encounter (Signed)
Spoke with pt. Scheduled for TCS with Dr. Marletta Lor ASA 2 10/4. Aware will send instructions via mychart. Request smallest volume prep. Clenpiq sent to [facility]

## 2023-08-30 ENCOUNTER — Encounter: Payer: Self-pay | Admitting: Internal Medicine

## 2023-08-30 DIAGNOSIS — F411 Generalized anxiety disorder: Secondary | ICD-10-CM

## 2023-08-31 ENCOUNTER — Telehealth: Payer: Self-pay | Admitting: *Deleted

## 2023-08-31 MED ORDER — CLONAZEPAM 1 MG PO TABS
1.0000 mg | ORAL_TABLET | Freq: Two times a day (BID) | ORAL | 0 refills | Status: DC | PRN
Start: 2023-08-31 — End: 2023-09-02

## 2023-08-31 NOTE — Telephone Encounter (Signed)
Spoke with pt. He was having a lot of anxiety regarding the procedure is why he had cancelled but has decided he will stay on. He was nervous about the prep/procedure. Discussed prep with him and gave tips on how to get down better. If any issues aware can call main # to get answering service if we are not here after hours. I did advise him if he felt more at [facility] starting prep 1 hour earlier to ensure the prep was not going to make him sick and reached [place] prior to closing this was fine to ease his anxiety. He was very thankful for the tips and will try to see how he does.

## 2023-08-31 NOTE — Telephone Encounter (Signed)
Young, Carolyn H  Daisy Mcneel S, CMA Hey,  This patient's wife has now called back and said they spoke with pt's PCP and they want to proceed with procedure but she LM originally and stated he was sick.  Can you please call the patient and find out if we are going to leave it on the schedule.  If he is really sick then I don't know that we can proceed even if the PCP wants him to.  Thanks,

## 2023-08-31 NOTE — Telephone Encounter (Signed)
Spoke with pt. He is staying on for 10/4

## 2023-08-31 NOTE — Telephone Encounter (Signed)
-----   Message from Anabel Bene sent at 08/31/2023 11:34 AM EDT ----- Patient's wife called and states he is sick.  I have put him in the depot.  Thanks,

## 2023-08-31 NOTE — Telephone Encounter (Signed)
Pt on for 10/4 with Dr. Marletta Lor.

## 2023-09-01 NOTE — Anesthesia Preprocedure Evaluation (Addendum)
Anesthesia Evaluation  Patient identified by MRN, date of birth, ID band Patient awake    Reviewed: Allergy & Precautions, NPO status , Patient's Chart, lab work & pertinent test results  History of Anesthesia Complications Negative for: history of anesthetic complications  Airway Mallampati: II  TM Distance: >3 FB Neck ROM: Full    Dental  (+) Chipped, Dental Advisory Given   Pulmonary Current Smoker and Patient abstained from smoking.   Pulmonary exam normal breath sounds clear to auscultation       Cardiovascular negative cardio ROS Normal cardiovascular exam Rhythm:Regular Rate:Normal     Neuro/Psych    GI/Hepatic negative GI ROS, Neg liver ROS,,,  Endo/Other  negative endocrine ROS    Renal/GU Renal disease (stones)     Musculoskeletal   Abdominal   Peds  Hematology negative hematology ROS (+)   Anesthesia Other Findings   Reproductive/Obstetrics                             Anesthesia Physical Anesthesia Plan  ASA: 2  Anesthesia Plan: General   Post-op Pain Management: Minimal or no pain anticipated   Induction: Intravenous  PONV Risk Score and Plan: Propofol infusion  Airway Management Planned: Nasal Cannula and Natural Airway  Additional Equipment: None  Intra-op Plan:   Post-operative Plan:   Informed Consent: I have reviewed the patients History and Physical, chart, labs and discussed the procedure including the risks, benefits and alternatives for the proposed anesthesia with the patient or authorized representative who has indicated his/her understanding and acceptance.     Dental advisory given  Plan Discussed with: CRNA  Anesthesia Plan Comments:         Anesthesia Quick Evaluation

## 2023-09-02 ENCOUNTER — Ambulatory Visit (HOSPITAL_COMMUNITY): Payer: No Typology Code available for payment source | Admitting: Anesthesiology

## 2023-09-02 ENCOUNTER — Encounter (HOSPITAL_COMMUNITY)
Admission: RE | Disposition: A | Discharge status: Home / Self Care | Payer: Self-pay | Source: Home / Self Care | Attending: Internal Medicine

## 2023-09-02 ENCOUNTER — Encounter (HOSPITAL_COMMUNITY): Payer: Self-pay

## 2023-09-02 ENCOUNTER — Ambulatory Visit (HOSPITAL_COMMUNITY)
Admission: RE | Admit: 2023-09-02 | Discharge: 2023-09-02 | Disposition: A | Discharge status: Home / Self Care | Payer: No Typology Code available for payment source | Attending: Internal Medicine | Admitting: Internal Medicine

## 2023-09-02 ENCOUNTER — Other Ambulatory Visit: Payer: Self-pay

## 2023-09-02 DIAGNOSIS — K5730 Diverticulosis of large intestine without perforation or abscess without bleeding: Secondary | ICD-10-CM | POA: Diagnosis not present

## 2023-09-02 DIAGNOSIS — K635 Polyp of colon: Secondary | ICD-10-CM

## 2023-09-02 DIAGNOSIS — D126 Benign neoplasm of colon, unspecified: Secondary | ICD-10-CM | POA: Diagnosis not present

## 2023-09-02 DIAGNOSIS — F17210 Nicotine dependence, cigarettes, uncomplicated: Secondary | ICD-10-CM | POA: Diagnosis not present

## 2023-09-02 DIAGNOSIS — D122 Benign neoplasm of ascending colon: Secondary | ICD-10-CM | POA: Insufficient documentation

## 2023-09-02 DIAGNOSIS — D125 Benign neoplasm of sigmoid colon: Secondary | ICD-10-CM | POA: Insufficient documentation

## 2023-09-02 DIAGNOSIS — Z1211 Encounter for screening for malignant neoplasm of colon: Secondary | ICD-10-CM | POA: Diagnosis present

## 2023-09-02 DIAGNOSIS — K648 Other hemorrhoids: Secondary | ICD-10-CM | POA: Diagnosis not present

## 2023-09-02 DIAGNOSIS — N189 Chronic kidney disease, unspecified: Secondary | ICD-10-CM | POA: Diagnosis not present

## 2023-09-02 HISTORY — PX: COLONOSCOPY WITH PROPOFOL: SHX5780

## 2023-09-02 HISTORY — PX: POLYPECTOMY: SHX5525

## 2023-09-02 SURGERY — COLONOSCOPY WITH PROPOFOL
Anesthesia: General

## 2023-09-02 MED ORDER — STERILE WATER FOR IRRIGATION IR SOLN
Status: DC | PRN
  Administered 2023-09-02: 60 mL

## 2023-09-02 MED ORDER — PROPOFOL 500 MG/50ML IV EMUL
INTRAVENOUS | Status: DC | PRN
  Administered 2023-09-02: 150 ug/kg/min via INTRAVENOUS

## 2023-09-02 MED ORDER — LACTATED RINGERS IV SOLN: INTRAVENOUS | Status: DC

## 2023-09-02 MED ORDER — PROPOFOL 10 MG/ML IV BOLUS
INTRAVENOUS | Status: DC | PRN
  Administered 2023-09-02: 100 mg via INTRAVENOUS

## 2023-09-02 NOTE — Discharge Instructions (Addendum)
  Colonoscopy Discharge Instructions  Read the instructions outlined below and refer to this sheet in the next few weeks. These discharge instructions provide you with general information on caring for yourself after you leave the hospital. Your doctor may also give you specific instructions. While your treatment has been planned according to the most current medical practices available, unavoidable complications occasionally occur.   ACTIVITY You may resume your regular activity, but move at a slower pace for the next 24 hours.  Take frequent rest periods for the next 24 hours.  Walking will help get rid of the air and reduce the bloated feeling in your belly (abdomen).  No driving for 24 hours (because of the medicine (anesthesia) used during the test).   Do not sign any important legal documents or operate any machinery for 24 hours (because of the anesthesia used during the test).  NUTRITION Drink plenty of fluids.  You may resume your normal diet as instructed by your doctor.  Begin with a light meal and progress to your normal diet. Heavy or fried foods are harder to digest and may make you feel sick to your stomach (nauseated).  Avoid alcoholic beverages for 24 hours or as instructed.  MEDICATIONS You may resume your normal medications unless your doctor tells you otherwise.  WHAT YOU CAN EXPECT TODAY Some feelings of bloating in the abdomen.  Passage of more gas than usual.  Spotting of blood in your stool or on the toilet paper.  IF YOU HAD POLYPS REMOVED DURING THE COLONOSCOPY: No aspirin products for 7 days or as instructed.  No alcohol for 7 days or as instructed.  Eat a soft diet for the next 24 hours.  FINDING OUT THE RESULTS OF YOUR TEST Not all test results are available during your visit. If your test results are not back during the visit, make an appointment with your caregiver to find out the results. Do not assume everything is normal if you have not heard from your  caregiver or the medical facility. It is important for you to follow up on all of your test results.  SEEK IMMEDIATE MEDICAL ATTENTION IF: You have more than a spotting of blood in your stool.  Your belly is swollen (abdominal distention).  You are nauseated or vomiting.  You have a temperature over 101.  You have abdominal pain or discomfort that is severe or gets worse throughout the day.   Your colonoscopy revealed 3 polyp(s) which I removed successfully. Await pathology results, my office will contact you. I recommend repeating colonoscopy in  5 years for surveillance purposes.   You also have diverticulosis and internal hemorrhoids. I would recommend increasing fiber in your diet or adding OTC Benefiber/Metamucil. Be sure to drink at least 4 to 6 glasses of water daily. We can arrange hemorrhoid banding in our office if bleeding continues, otherwise follow up as needed.    I hope you have a great rest of your week!  Hennie Duos. Marletta Lor, D.O. Gastroenterology and Hepatology [facility]

## 2023-09-02 NOTE — H&P (Signed)
Primary Care Physician:  Billie Lade, MD Primary Gastroenterologist:  Dr. Marletta Lor  Pre-Procedure History & Physical: HPI:  Caleb Kapusta. is a 47 y.o. male is here for first ever colonoscopy for colon cancer screening purposes.   Past Medical History:  Diagnosis Date   Chronic kidney disease    KIDNEY STONE   Diverticulitis 05/29/2013   Suggestion of microperforation on CT.   Epigastric hernia 08/11/2015    Past Surgical History:  Procedure Laterality Date   EPIGASTRIC HERNIA REPAIR N/A 08/20/2015   Procedure: HERNIA REPAIR EPIGASTRIC ADULT;  Surgeon: Earline Mayotte, MD;  Location: [facility] ORS;  Service: General;  Laterality: N/A;   HERNIA REPAIR  08/20/2015   Epigastric and umbilical hernia, 6.4 cm Vemtrax mesh   NO PAST SURGERIES      Prior to Admission medications   Medication Sig Start Date End Date Taking? Authorizing Provider  Sod Picosulfate-Mag Ox-Cit Acd (CLENPIQ) 10-3.5-12 MG-GM -GM/175ML SOLN Take 1 kit by mouth as directed. 08/16/23  Yes Raahi Korber, Hennie Duos, DO  clonazePAM (KLONOPIN) 1 MG tablet Take 1 tablet (1 mg total) by mouth 2 (two) times daily as needed for up to 4 doses for anxiety. 08/31/23   Billie Lade, MD  FLUoxetine (PROZAC) 20 MG tablet Take 1 tablet (20 mg total) by mouth daily. Patient not taking: Reported on 08/04/2023 07/22/23   Billie Lade, MD  promethazine (PHENERGAN) 25 MG tablet Take 1 tablet (25 mg total) by mouth every 8 (eight) hours as needed for nausea or vomiting. Patient not taking: Reported on 08/04/2023 07/22/23   Billie Lade, MD    Allergies as of 08/16/2023 - Review Complete 08/04/2023  Allergen Reaction Noted   Dilaudid [hydromorphone hcl] Shortness Of Breath 12/24/2014   Propoxyphene Shortness Of Breath, Nausea And Vomiting, and Rash 12/24/2014    Family History  Problem Relation Age of Onset   Diabetes Father     Social History   Socioeconomic History   Marital status: Married    Spouse name: Not on file    Number of children: Not on file   Years of education: Not on file   Highest education level: Not on file  Occupational History   Not on file  Tobacco Use   Smoking status: Every Day    Current packs/day: 1.00    Average packs/day: 1 pack/day for 17.0 years (17.0 ttl pk-yrs)    Types: Cigarettes   Smokeless tobacco: Current  Substance and Sexual Activity   Alcohol use: No   Drug use: No   Sexual activity: Yes    Birth control/protection: None  Other Topics Concern   Not on file  Social History Narrative   Not on file   Social Determinants of Health   Financial Resource Strain: Not on file  Food Insecurity: Not on file  Transportation Needs: Not on file  Physical Activity: Not on file  Stress: Not on file  Social Connections: Not on file  Intimate Partner Violence: Not on file    Review of Systems: See HPI, otherwise negative ROS  Physical Exam: Vital signs in last 24 hours: Temp:  [98.3 F (36.8 C)] 98.3 F (36.8 C) (10/04 0653) Pulse Rate:  [63] 63 (10/04 0653) Resp:  [19] 19 (10/04 0653) BP: (108)/(78) 108/78 (10/04 0653) SpO2:  [99 %] 99 % (10/04 0653) Weight:  [70.3 kg] 70.3 kg (10/04 0653)   General:   Alert,  Well-developed, well-nourished, pleasant and cooperative in NAD Head:  Normocephalic  and atraumatic. Eyes:  Sclera clear, no icterus.   Conjunctiva pink. Ears:  Normal auditory acuity. Nose:  No deformity, discharge,  or lesions. Msk:  Symmetrical without gross deformities. Normal posture. Extremities:  Without clubbing or edema. Neurologic:  Alert and  oriented x4;  grossly normal neurologically. Skin:  Intact without significant lesions or rashes. Psych:  Alert and cooperative. Normal mood and affect.  Impression/Plan: Caleb Clements. is here for a colonoscopy to be performed for colon cancer screening purposes.  The risks of the procedure including infection, bleed, or perforation as well as benefits, limitations, alternatives and  imponderables have been reviewed with the patient. Questions have been answered. All parties agreeable.

## 2023-09-02 NOTE — Anesthesia Postprocedure Evaluation (Signed)
Anesthesia Post Note  Patient: Caleb Clements.  Procedure(s) Performed: COLONOSCOPY WITH PROPOFOL POLYPECTOMY  Patient location during evaluation: PACU Anesthesia Type: General Level of consciousness: awake and alert Pain management: pain level controlled Vital Signs Assessment: post-procedure vital signs reviewed and stable Respiratory status: spontaneous breathing, nonlabored ventilation, respiratory function stable and patient connected to nasal cannula oxygen Cardiovascular status: blood pressure returned to baseline and stable Postop Assessment: no apparent nausea or vomiting Anesthetic complications: no   There were no known notable events for this encounter.   Last Vitals:  Vitals:   09/02/23 0653 09/02/23 0752  BP: 108/78 109/69  Pulse: 63 74  Resp: 19 18  Temp: 36.8 C 36.7 C  SpO2: 99% 100%    Last Pain:  Vitals:   09/02/23 0752  TempSrc: Axillary  PainSc: 0-No pain                 Asti Mackley L Luvinia Lucy

## 2023-09-02 NOTE — Op Note (Signed)
[facility] Patient Name: Caleb Clements Procedure Date: 09/02/2023 7:13 AM MRN: [id number] Date of Birth: 12/10/1975 Attending MD: Hennie Duos. Marletta Lor , [place], [id number] CSN: [id number] Age: 47 Admit Type: Outpatient Procedure:                Colonoscopy Indications:              Screening for colorectal malignant neoplasm Providers:                Hennie Duos. Marletta Lor, DO, Angelica Ran, Francoise Ceo RN,                            RN, Judeth Cornfield. Jessee Avers, Technician Referring MD:              Medicines:                See the Anesthesia note for documentation of the                            administered medications Complications:            No immediate complications. Estimated Blood Loss:     Estimated blood loss was minimal. Procedure:                Pre-Anesthesia Assessment:                           - The anesthesia plan was to use monitored                            anesthesia care (MAC).                           After obtaining informed consent, the colonoscope                            was passed under direct vision. Throughout the                            procedure, the patient's blood pressure, pulse, and                            oxygen saturations were monitored continuously. The                            [id number]-HQ190L ([id number]) scope was introduced through                            the anus and advanced to the the cecum, identified                            by appendiceal orifice and ileocecal valve. The                            colonoscopy was performed without difficulty. The  patient tolerated the procedure well. The quality                            of the bowel preparation was evaluated using the                            BBPS [facility] Bowel Preparation Scale) with scores                            of: Right Colon = 3, Transverse Colon = 3 and Left                            Colon = 3 (entire mucosa seen well with no residual                             staining, small fragments of stool or opaque                            liquid). The total BBPS score equals 9. Scope In: 7:31:43 AM Scope Out: 7:49:40 AM Scope Withdrawal Time: 0 hours 15 minutes 50 seconds  Total Procedure Duration: 0 hours 17 minutes 57 seconds  Findings:      Non-bleeding internal hemorrhoids were found during retroflexion. The       hemorrhoids were moderate.      Multiple medium-mouthed and small-mouthed diverticula were found in the       sigmoid colon and descending colon.      Two sessile polyps were found in the ascending colon. The polyps were 5       to 7 mm in size. These polyps were removed with a cold snare. Resection       and retrieval were complete.      A 4 mm polyp was found in the sigmoid colon. The polyp was sessile. The       polyp was removed with a cold snare. Resection and retrieval were       complete.      The exam was otherwise without abnormality. Impression:               - Non-bleeding internal hemorrhoids.                           - Diverticulosis in the sigmoid colon and in the                            descending colon.                           - Two 5 to 7 mm polyps in the ascending colon,                            removed with a cold snare. Resected and retrieved.                           - One 4 mm polyp in the sigmoid colon, removed with  a cold snare. Resected and retrieved.                           - The examination was otherwise normal. Moderate Sedation:      Per Anesthesia Care Recommendation:           - Patient has a contact number available for                            emergencies. The signs and symptoms of potential                            delayed complications were discussed with the                            patient. Return to normal activities tomorrow.                            Written discharge instructions were provided to the                             patient.                           - Resume previous diet.                           - Continue present medications.                           - Await pathology results.                           - Repeat colonoscopy in 5 years for surveillance.                           - Return to GI clinic PRN for hemorrhoid banding Procedure Code(s):        --- Professional ---                           [phone], Colonoscopy, flexible; with removal of                            tumor(s), polyp(s), or other lesion(s) by snare                            technique Diagnosis Code(s):        --- Professional ---                           Z12.11, Encounter for screening for malignant                            neoplasm of colon                           K64.8, Other hemorrhoids  D12.2, Benign neoplasm of ascending colon                           D12.5, Benign neoplasm of sigmoid colon                           K57.30, Diverticulosis of large intestine without                            perforation or abscess without bleeding CPT copyright 2022 [employer]. All rights reserved. The codes documented in this report are preliminary and upon coder review may  be revised to meet current compliance requirements. Hennie Duos. Marletta Lor, DO Hennie Duos. Jamesmichael Shadd, DO 09/02/2023 7:57:00 AM This report has been signed electronically. Number of Addenda: 0

## 2023-09-02 NOTE — Transfer of Care (Signed)
Immediate Anesthesia Transfer of Care Note  Patient: Caleb Clements.  Procedure(s) Performed: COLONOSCOPY WITH PROPOFOL POLYPECTOMY  Patient Location: Endoscopy Unit  Anesthesia Type:General  Level of Consciousness: awake, alert , and oriented  Airway & Oxygen Therapy: Patient Spontanous Breathing  Post-op Assessment: Report given to RN and Post -op Vital signs reviewed and stable  Post vital signs: Reviewed and stable  Last Vitals:  Vitals Value Taken Time  BP 109/69 09/02/23 0752  Temp 36.7 C 09/02/23 0752  Pulse 74 09/02/23 0752  Resp 18 09/02/23 0752  SpO2 100 % 09/02/23 0752    Last Pain:  Vitals:   09/02/23 0752  TempSrc: Axillary  PainSc: 0-No pain      Patients Stated Pain Goal: 8 (09/02/23 0653)  Complications: No notable events documented.

## 2023-09-05 LAB — SURGICAL PATHOLOGY

## 2023-09-06 ENCOUNTER — Ambulatory Visit: Payer: No Typology Code available for payment source | Admitting: Internal Medicine

## 2023-09-12 ENCOUNTER — Encounter (HOSPITAL_COMMUNITY): Payer: Self-pay | Admitting: Internal Medicine

## 2023-10-14 ENCOUNTER — Ambulatory Visit: Payer: No Typology Code available for payment source | Admitting: Internal Medicine
